# Patient Record
Sex: Male | Born: 1985 | Race: Black or African American | Hispanic: No | Marital: Single | Smoking: Current every day smoker
Health system: Southern US, Community
[De-identification: ages and names within clinical notes are randomized; demographics above are authoritative.]

## PROBLEM LIST (undated history)

## (undated) DIAGNOSIS — E7800 Pure hypercholesterolemia, unspecified: Secondary | ICD-10-CM

## (undated) DIAGNOSIS — E119 Type 2 diabetes mellitus without complications: Secondary | ICD-10-CM

## (undated) DIAGNOSIS — I10 Essential (primary) hypertension: Secondary | ICD-10-CM

## (undated) HISTORY — DX: Essential (primary) hypertension: I10

## (undated) HISTORY — DX: Type 2 diabetes mellitus without complications: E11.9

## (undated) HISTORY — PX: NO PAST SURGERIES: SHX2092

---

## 2005-04-24 ENCOUNTER — Emergency Department: Payer: Self-pay | Admitting: Emergency Medicine

## 2008-11-17 ENCOUNTER — Emergency Department (HOSPITAL_COMMUNITY): Admission: EM | Admit: 2008-11-17 | Discharge: 2008-11-17 | Payer: Self-pay | Admitting: Emergency Medicine

## 2009-04-23 ENCOUNTER — Emergency Department: Payer: Self-pay | Admitting: Emergency Medicine

## 2010-02-10 ENCOUNTER — Emergency Department: Payer: Self-pay | Admitting: Internal Medicine

## 2011-04-02 ENCOUNTER — Emergency Department: Payer: Self-pay | Admitting: Emergency Medicine

## 2011-04-03 ENCOUNTER — Emergency Department (HOSPITAL_COMMUNITY): Payer: Medicaid Other

## 2011-04-03 ENCOUNTER — Encounter: Payer: Self-pay | Admitting: *Deleted

## 2011-04-03 ENCOUNTER — Emergency Department (HOSPITAL_COMMUNITY)
Admission: EM | Admit: 2011-04-03 | Discharge: 2011-04-03 | Disposition: A | Discharge status: Home / Self Care | Payer: Medicaid Other | Attending: Emergency Medicine | Admitting: Emergency Medicine

## 2011-04-03 DIAGNOSIS — M25519 Pain in unspecified shoulder: Secondary | ICD-10-CM | POA: Insufficient documentation

## 2011-04-03 DIAGNOSIS — S139XXA Sprain of joints and ligaments of unspecified parts of neck, initial encounter: Secondary | ICD-10-CM | POA: Insufficient documentation

## 2011-04-03 DIAGNOSIS — S161XXA Strain of muscle, fascia and tendon at neck level, initial encounter: Secondary | ICD-10-CM

## 2011-04-03 DIAGNOSIS — S39012A Strain of muscle, fascia and tendon of lower back, initial encounter: Secondary | ICD-10-CM

## 2011-04-03 DIAGNOSIS — S335XXA Sprain of ligaments of lumbar spine, initial encounter: Secondary | ICD-10-CM | POA: Insufficient documentation

## 2011-04-03 MED ORDER — PREDNISONE 10 MG PO TABS: ORAL_TABLET | ORAL | Status: DC

## 2011-04-03 MED ORDER — METHOCARBAMOL 500 MG PO TABS: ORAL_TABLET | ORAL | Status: DC

## 2011-04-03 NOTE — ED Notes (Signed)
Pt a/ox4. Resp even and unlabored. NAD at this time. D/C instructions reviewed with pt. Pt verbalized understanding. Pt ambulated with steady gate to POV. 
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
## 2011-04-03 NOTE — ED Provider Notes (Signed)
Medical screening examination/treatment/procedure(s) were performed by non-physician practitioner and as supervising physician I was immediately available for consultation/collaboration.   Lyanne Co, MD 04/03/11 [phone]

## 2011-10-23 ENCOUNTER — Emergency Department: Payer: Self-pay | Admitting: Emergency Medicine

## 2012-01-13 ENCOUNTER — Emergency Department: Payer: Self-pay | Admitting: Emergency Medicine

## 2012-12-18 ENCOUNTER — Emergency Department: Payer: Self-pay | Admitting: Emergency Medicine

## 2014-10-10 DIAGNOSIS — I10 Essential (primary) hypertension: Secondary | ICD-10-CM | POA: Insufficient documentation

## 2016-07-15 ENCOUNTER — Encounter: Payer: Self-pay | Admitting: Emergency Medicine

## 2016-07-15 ENCOUNTER — Emergency Department
Admission: EM | Admit: 2016-07-15 | Discharge: 2016-07-15 | Disposition: A | Discharge status: Home / Self Care | Payer: BLUE CROSS/BLUE SHIELD | Attending: Emergency Medicine | Admitting: Emergency Medicine

## 2016-07-15 DIAGNOSIS — F17200 Nicotine dependence, unspecified, uncomplicated: Secondary | ICD-10-CM | POA: Diagnosis not present

## 2016-07-15 DIAGNOSIS — A549 Gonococcal infection, unspecified: Secondary | ICD-10-CM

## 2016-07-15 DIAGNOSIS — R300 Dysuria: Secondary | ICD-10-CM | POA: Diagnosis present

## 2016-07-15 LAB — URINALYSIS, COMPLETE (UACMP) WITH MICROSCOPIC
BILIRUBIN URINE: NEGATIVE
Glucose, UA: NEGATIVE mg/dL
HGB URINE DIPSTICK: NEGATIVE
Ketones, ur: 20 mg/dL — AB
Nitrite: NEGATIVE
PH: 6 (ref 5.0–8.0)
Protein, ur: 100 mg/dL — AB
SPECIFIC GRAVITY, URINE: 1.033 — AB (ref 1.005–1.030)
Squamous Epithelial / LPF: NONE SEEN

## 2016-07-15 LAB — CHLAMYDIA/NGC RT PCR (ARMC ONLY)
Chlamydia Tr: NOT DETECTED
N GONORRHOEAE: DETECTED — AB

## 2016-07-15 MED ORDER — CEFTRIAXONE SODIUM 250 MG IJ SOLR
250.0000 mg | Freq: Once | INTRAMUSCULAR | Status: AC
  Administered 2016-07-15: 250 mg via INTRAMUSCULAR
  Filled 2016-07-15: qty 250

## 2016-07-15 NOTE — ED Notes (Signed)
Pt stated he noticed some penile discharge about 2 weeks ago. Wanted to get checked out.

## 2016-07-15 NOTE — ED Provider Notes (Signed)
[facility] Emergency Department Provider Note   ____________________________________________   First MD Initiated Contact with Patient 07/15/16 2136     (approximate)  I have reviewed the triage vital signs and the nursing notes.   HISTORY  Chief Complaint Penile Discharge    HPI Devin Andrews is a 30 y.o. male patient complain of pain at discharge and dysuria for 2 days. Patient state he batted baseball to either Chlamydia or gonorrhea. Last Sexual contact unprotected 5-6 days ago. Patient denies any penial lesions or inguinal swelling. No palliative measures taken for this complaint. Patient rates his pain discomfort as a 5/10. Patient described a pain as "burning".   History reviewed. No pertinent past medical history.  There are no active problems to display for this patient.   History reviewed. No pertinent surgical history.  Prior to Admission medications   Medication Sig Start Date End Date Taking? Authorizing Provider  methocarbamol (ROBAXIN) 500 MG tablet 2 po tid for spasm 04/03/11   Ivery QualeHobson Bryant, PA-C  predniSONE (DELTASONE) 10 MG tablet 6,5,4,3,2,1 - take with food 04/03/11   Ivery QualeHobson Bryant, PA-C    Allergies Bee venom  No family history on file.  Social History Social History  Substance Use Topics  . Smoking status: Current Every Day Smoker    Packs/day: 1.00  . Smokeless tobacco: Former NeurosurgeonUser  . Alcohol use No    Review of Systems Constitutional: No fever/chills Eyes: No visual changes. ENT: No sore throat. Cardiovascular: Denies chest pain. Respiratory: Denies shortness of breath. Gastrointestinal: No abdominal pain.  No nausea, no vomiting.  No diarrhea.  No constipation. Genitourinary: Positive for dysuria. Musculoskeletal: Negative for back pain. Skin: Negative for rash. Neurological: Negative for headaches, focal weakness or numbness.    ____________________________________________   PHYSICAL  EXAM:  VITAL SIGNS: ED Triage Vitals  Enc Vitals Group     BP 07/15/16 1939 (!) 155/104     Pulse Rate 07/15/16 1939 94     Resp 07/15/16 1939 20     Temp 07/15/16 1939 97.6 F (36.4 C)     Temp Source 07/15/16 1939 Oral     SpO2 07/15/16 1939 97 %     Weight 07/15/16 1938 280 lb (127 kg)     Height 07/15/16 1938 6\' 3"  (1.905 m)     Head Circumference --      Peak Flow --      Pain Score 07/15/16 1938 5     Pain Loc --      Pain Edu? --      Excl. in GC? --     Constitutional: Alert and oriented. Well appearing and in no acute distress. Eyes: Conjunctivae are normal. PERRL. EOMI. Head: Atraumatic. Nose: No congestion/rhinnorhea. Mouth/Throat: Mucous membranes are moist.  Oropharynx non-erythematous. Neck: No stridor.  No cervical spine tenderness to palpation. Hematological/Lymphatic/Immunilogical: No cervical lymphadenopathy. Cardiovascular: Normal rate, regular rhythm. Grossly normal heart sounds.  Good peripheral circulation. Respiratory: Normal respiratory effort.  No retractions. Lungs CTAB. Gastrointestinal: Soft and nontender. No distention. No abdominal bruits. No CVA tenderness. Musculoskeletal: No lower extremity tenderness nor edema.  No joint effusions. Neurologic:  Normal speech and language. No gross focal neurologic deficits are appreciated. No gait instability. Skin:  Skin is warm, dry and intact. No rash noted. Psychiatric: Mood and affect are normal. Speech and behavior are normal.  ____________________________________________   LABS (all labs ordered are listed, but only abnormal results are displayed)  Labs Reviewed  CHLAMYDIA/NGC RT PCR [facility]  ONLY) - Abnormal; Notable for the following:       Result Value   N gonorrhoeae DETECTED (*)    All other components within normal limits  URINALYSIS, COMPLETE (UACMP) WITH MICROSCOPIC - Abnormal; Notable for the following:    Color, Urine YELLOW (*)    APPearance CLOUDY (*)    Specific Gravity,  Urine 1.033 (*)    Ketones, ur 20 (*)    Protein, ur 100 (*)    Leukocytes, UA LARGE (*)    Bacteria, UA RARE (*)    All other components within normal limits   ____________________________________________  EKG   ____________________________________________  RADIOLOGY   ____________________________________________   PROCEDURES  Procedure(s) performed: None  Procedures  Critical Care performed: No  ____________________________________________   INITIAL IMPRESSION / ASSESSMENT AND PLAN / ED COURSE  Pertinent labs & imaging results that were available during my care of the patient were reviewed by me and considered in my medical decision making (see chart for details).  Gonorrhea. Patient given discharge care instructions and [facility]  Clinical Course   Discussed lab results with patient. Patient given Rocephin 250 mg IM.   ____________________________________________   FINAL CLINICAL IMPRESSION(S) / ED DIAGNOSES  Final diagnoses:  Gonorrhea      NEW MEDICATIONS STARTED DURING THIS VISIT:  New Prescriptions   No medications on file     Note:  This document was prepared using Dragon voice recognition software and may include unintentional dictation errors.    Joni ReiningRonald K Amiri Tritch, PA-C 07/15/16 2231    Sharman CheekPhillip Stafford, MD 07/16/16 [phone]

## 2016-07-15 NOTE — ED Triage Notes (Addendum)
Patient ambulatory to triage with steady gait, without difficulty or distress noted; pt reports penile discharge, dysuria and thinks he has been exposed to chlamydia and gonnhorea

## 2016-07-16 ENCOUNTER — Telehealth: Payer: Self-pay | Admitting: Emergency Medicine

## 2016-07-16 NOTE — Telephone Encounter (Signed)
Called patient to ask him to take one more antibiotic to comply with cdc guidelines for treatment of gonorrhea.  Per dr Darnelle Catalanmalinda call in azithromycin 1 gram po. Med called to [facility] [place].

## 2016-09-18 ENCOUNTER — Encounter: Payer: Self-pay | Admitting: *Deleted

## 2016-09-18 ENCOUNTER — Telehealth: Payer: Self-pay | Admitting: Emergency Medicine

## 2016-09-18 ENCOUNTER — Emergency Department
Admission: EM | Admit: 2016-09-18 | Discharge: 2016-09-18 | Disposition: A | Discharge status: Home / Self Care | Payer: BLUE CROSS/BLUE SHIELD | Attending: Emergency Medicine | Admitting: Emergency Medicine

## 2016-09-18 DIAGNOSIS — Z79899 Other long term (current) drug therapy: Secondary | ICD-10-CM | POA: Insufficient documentation

## 2016-09-18 DIAGNOSIS — N342 Other urethritis: Secondary | ICD-10-CM | POA: Insufficient documentation

## 2016-09-18 DIAGNOSIS — F17200 Nicotine dependence, unspecified, uncomplicated: Secondary | ICD-10-CM | POA: Diagnosis not present

## 2016-09-18 DIAGNOSIS — A64 Unspecified sexually transmitted disease: Secondary | ICD-10-CM

## 2016-09-18 DIAGNOSIS — R369 Urethral discharge, unspecified: Secondary | ICD-10-CM | POA: Diagnosis present

## 2016-09-18 LAB — URINALYSIS, COMPLETE (UACMP) WITH MICROSCOPIC
BILIRUBIN URINE: NEGATIVE
Bacteria, UA: NONE SEEN
GLUCOSE, UA: NEGATIVE mg/dL
HGB URINE DIPSTICK: NEGATIVE
KETONES UR: NEGATIVE mg/dL
Nitrite: NEGATIVE
PH: 6 (ref 5.0–8.0)
Protein, ur: NEGATIVE mg/dL
Specific Gravity, Urine: 1.026 (ref 1.005–1.030)
Squamous Epithelial / LPF: NONE SEEN

## 2016-09-18 LAB — CHLAMYDIA/NGC RT PCR (ARMC ONLY)
Chlamydia Tr: NOT DETECTED
N GONORRHOEAE: DETECTED — AB

## 2016-09-18 MED ORDER — LIDOCAINE HCL (PF) 1 % IJ SOLN
INTRAMUSCULAR | Status: AC
  Filled 2016-09-18: qty 5

## 2016-09-18 MED ORDER — CEFTRIAXONE SODIUM 1 G IJ SOLR
500.0000 mg | Freq: Once | INTRAMUSCULAR | Status: AC
  Administered 2016-09-18: 500 mg via INTRAMUSCULAR
  Filled 2016-09-18: qty 10

## 2016-09-18 MED ORDER — AZITHROMYCIN 500 MG PO TABS
1000.0000 mg | ORAL_TABLET | Freq: Once | ORAL | Status: AC
  Administered 2016-09-18: 1000 mg via ORAL
  Filled 2016-09-18: qty 2

## 2016-09-18 NOTE — Telephone Encounter (Signed)
Called patient and informed of std results.  He was treated here already

## 2016-09-18 NOTE — ED Provider Notes (Signed)
[facility]-EMERGENCY DEPARTMENT Provider Note   CSN: [id number] Arrival date & time: 09/18/16  1527     History   Chief Complaint Chief Complaint  Patient presents with  . SEXUALLY TRANSMITTED DISEASE    HPI Devin Andrews is a 31 y.o. male presents to the emergency department for evaluation of penile discharge and burning sensation. Patient has had symptoms for 3 days. Was diagnosed with gonorrhea on 07/15/2016. Patient was treated with ceftriaxone. He now has a new sexual partner, male. She is also having some symptoms. Patient denies any testicular pain, swelling. No lesions or painful sores along the penis.  HPI  History reviewed. No pertinent past medical history.  There are no active problems to display for this patient.   History reviewed. No pertinent surgical history.     Home Medications    Prior to Admission medications   Medication Sig Start Date End Date Taking? Authorizing Provider  methocarbamol (ROBAXIN) 500 MG tablet 2 po tid for spasm 04/03/11   Ivery QualeHobson Bryant, PA-C  predniSONE (DELTASONE) 10 MG tablet 6,5,4,3,2,1 - take with food 04/03/11   Ivery QualeHobson Bryant, PA-C    Family History No family history on file.  Social History Social History  Substance Use Topics  . Smoking status: Current Every Day Smoker    Packs/day: 1.00  . Smokeless tobacco: Former NeurosurgeonUser  . Alcohol use No     Allergies   Bee venom   Review of Systems Review of Systems  Constitutional: Negative.  Negative for activity change, appetite change, chills and fever.  HENT: Negative for congestion, ear pain, mouth sores, rhinorrhea, sinus pressure, sore throat and trouble swallowing.   Eyes: Negative for photophobia, pain and discharge.  Respiratory: Negative for cough, chest tightness and shortness of breath.   Cardiovascular: Negative for chest pain and leg swelling.  Gastrointestinal: Negative for abdominal distention, abdominal pain, diarrhea, nausea and vomiting.    Genitourinary: Positive for discharge. Negative for difficulty urinating, dysuria, penile pain, penile swelling, scrotal swelling and testicular pain.  Musculoskeletal: Negative for arthralgias, back pain and gait problem.  Skin: Negative for color change and rash.  Neurological: Negative for dizziness and headaches.  Hematological: Negative for adenopathy.  Psychiatric/Behavioral: Negative for agitation and behavioral problems.     Physical Exam Updated Vital Signs BP (!) 164/100 (BP Location: Right Arm)   Pulse 64   Temp 98.8 F (37.1 C) (Oral)   Resp 20   Ht 6\' 3"  (1.905 m)   Wt 136.1 kg   SpO2 99%   BMI 37.50 kg/m   Physical Exam  Constitutional: He appears well-developed and well-nourished.  HENT:  Head: Normocephalic and atraumatic.  Eyes: Conjunctivae are normal.  Neck: Neck supple.  Cardiovascular: Normal rate and regular rhythm.   No murmur heard. Pulmonary/Chest: Effort normal and breath sounds normal. No respiratory distress.  Abdominal: Soft. There is no tenderness.  Genitourinary:  Genitourinary Comments: Patient is circumcised. There is mild clear penile discharge. There is no sign of skin lesions. Scrotum is soft nontender. Testicles nontender to palpation.  Musculoskeletal: He exhibits no edema.  Neurological: He is alert.  Skin: Skin is warm and dry.  Psychiatric: He has a normal mood and affect.  Nursing note and vitals reviewed.    ED Treatments / Results  Labs (all labs ordered are listed, but only abnormal results are displayed) Labs Reviewed  CHLAMYDIA/NGC RT PCR ([facility] ONLY)  URINALYSIS, COMPLETE (UACMP) WITH MICROSCOPIC    EKG  EKG Interpretation None  Radiology No results found.  Procedures Procedures (including critical care time)  Medications Ordered in ED Medications  cefTRIAXone (ROCEPHIN) injection 500 mg (not administered)  azithromycin (ZITHROMAX) tablet 1,000 mg (not administered)     Initial  Impression / Assessment and Plan / ED Course  I have reviewed the triage vital signs and the nursing notes.  Pertinent labs & imaging results that were available during my care of the patient were reviewed by me and considered in my medical decision making (see chart for details).     31 year old male with urethritis. Patient is treated with ceftriaxone and azithromycin. He will follow-up in the health department in one week. He is educated on avoiding sexual contact.  Final Clinical Impressions(s) / ED Diagnoses   Final diagnoses:  STD (sexually transmitted disease)  Urethritis    New Prescriptions New Prescriptions   No medications on file     Evon Slack, PA-C 09/18/16 1618    Governor Rooks, MD 09/18/16 [phone]

## 2016-09-18 NOTE — ED Triage Notes (Signed)
Pt was treated 1 month ago for gonorrhea, pt reports penile drainage with dysuria
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
## 2016-09-18 NOTE — Discharge Instructions (Signed)
Please avoid any sexual contact with partner. Please notify all partners of diagnosis. Please follow-up in the health department in one week for recheck.

## 2018-08-14 ENCOUNTER — Encounter (HOSPITAL_COMMUNITY): Payer: Self-pay | Admitting: Emergency Medicine

## 2018-08-14 ENCOUNTER — Emergency Department (HOSPITAL_COMMUNITY)
Admission: EM | Admit: 2018-08-14 | Discharge: 2018-08-14 | Discharge status: Against Medical Advice | Payer: BLUE CROSS/BLUE SHIELD | Attending: Emergency Medicine | Admitting: Emergency Medicine

## 2018-08-14 DIAGNOSIS — Z5321 Procedure and treatment not carried out due to patient leaving prior to being seen by health care provider: Secondary | ICD-10-CM | POA: Diagnosis not present

## 2018-08-14 DIAGNOSIS — M549 Dorsalgia, unspecified: Secondary | ICD-10-CM | POA: Diagnosis present

## 2018-08-14 NOTE — ED Notes (Signed)
Patient called for room - no answer.  

## 2018-08-14 NOTE — ED Triage Notes (Addendum)
Pt did not answer.

## 2018-08-14 NOTE — ED Notes (Signed)
Pt called 3x with no answer for room

## 2018-08-18 ENCOUNTER — Other Ambulatory Visit: Payer: Self-pay

## 2018-08-18 ENCOUNTER — Emergency Department (HOSPITAL_COMMUNITY)
Admission: EM | Admit: 2018-08-18 | Discharge: 2018-08-18 | Disposition: A | Discharge status: Home / Self Care | Payer: BLUE CROSS/BLUE SHIELD | Attending: Emergency Medicine | Admitting: Emergency Medicine

## 2018-08-18 DIAGNOSIS — I10 Essential (primary) hypertension: Secondary | ICD-10-CM | POA: Insufficient documentation

## 2018-08-18 DIAGNOSIS — R040 Epistaxis: Secondary | ICD-10-CM | POA: Insufficient documentation

## 2018-08-18 DIAGNOSIS — F17200 Nicotine dependence, unspecified, uncomplicated: Secondary | ICD-10-CM | POA: Diagnosis not present

## 2018-08-18 MED ORDER — OXYMETAZOLINE HCL 0.05 % NA SOLN
1.0000 | Freq: Once | NASAL | Status: AC
  Administered 2018-08-18: 1 via NASAL
  Filled 2018-08-18: qty 15

## 2018-08-18 MED ORDER — TRANEXAMIC ACID 1000 MG/10ML IV SOLN
500.0000 mg | Freq: Once | INTRAVENOUS | Status: AC
  Administered 2018-08-18: 500 mg via TOPICAL
  Filled 2018-08-18: qty 10

## 2018-08-18 MED ORDER — CHLORTHALIDONE 25 MG PO TABS: 25.0000 mg | ORAL_TABLET | Freq: Every day | ORAL | 0 refills | Status: DC

## 2018-08-18 MED ORDER — AMLODIPINE BESYLATE 5 MG PO TABS: 5.0000 mg | ORAL_TABLET | Freq: Every day | ORAL | 0 refills | Status: DC

## 2018-08-18 NOTE — ED Notes (Signed)
ENT cart at bedside and Dr. Shonna Chock in room.

## 2018-08-18 NOTE — ED Notes (Signed)
Patient spit up medium sized blood clot

## 2018-08-18 NOTE — ED Notes (Signed)
Pt and family state they understand instructions. Home stable with steady gait. 

## 2018-08-18 NOTE — Discharge Instructions (Addendum)
Monitor your blood pressure on a regular basis.  At least once a week, but preferably once a day.  Keep a record of your blood pressure readings, and take that record with you when you see your primary care provider.

## 2018-08-18 NOTE — ED Provider Notes (Signed)
[facility] EMERGENCY DEPARTMENT Provider Note   CSN: [id number] Arrival date & time: 08/18/18  0257     History   Chief Complaint Chief Complaint  Patient presents with  . Epistaxis    HPI Devin Andrews is a 33 y.o. male.  The history is provided by the patient.  Epistaxis  He comes in with right-sided nosebleed throughout the day today.  He does admit to having picked up his nose.  He denies aspirin use but does admit to ibuprofen use.  He is not on any anticoagulants or any other antiplatelet agents.  He has had minor nosebleeds in the past, but nothing this serious.  No past medical history on file.  There are no active problems to display for this patient.   No past surgical history on file.      Home Medications    Prior to Admission medications   Medication Sig Start Date End Date Taking? Authorizing Provider  methocarbamol (ROBAXIN) 500 MG tablet 2 po tid for spasm 04/03/11   Ivery Quale, PA-C  predniSONE (DELTASONE) 10 MG tablet 6,5,4,3,2,1 - take with food 04/03/11   Ivery Quale, PA-C    Family History No family history on file.  Social History Social History   Tobacco Use  . Smoking status: Current Every Day Smoker    Packs/day: 1.00  . Smokeless tobacco: Former Engineer, water Use Topics  . Alcohol use: No  . Drug use: No     Allergies   Bee venom   Review of Systems Review of Systems  HENT: Positive for nosebleeds.   All other systems reviewed and are negative.    Physical Exam Updated Vital Signs BP (!) 168/127   Pulse 78   Temp 98 F (36.7 C) (Oral)   Resp 20   Ht 6\' 3"  (1.905 m)   Wt (!) 145.2 kg   SpO2 100%   BMI 40.00 kg/m   Physical Exam Vitals signs and nursing note reviewed.    Obese 33 year old male, resting comfortably and in no acute distress. Vital signs are significant for elevated blood pressure. Oxygen saturation is 100%, which is normal. Head is normocephalic and atraumatic.  PERRLA, EOMI. Oropharynx is clear.  Blood seen in the right nostril with no clear bleeding site. Neck is nontender and supple without adenopathy or JVD. Back is nontender and there is no CVA tenderness. Lungs are clear without rales, wheezes, or rhonchi. Chest is nontender. Heart has regular rate and rhythm without murmur. Abdomen is soft, flat, nontender without masses or hepatosplenomegaly and peristalsis is normoactive. Extremities have no cyanosis or edema, full range of motion is present. Skin is warm and dry without rash. Neurologic: Mental status is normal, cranial nerves are intact, there are no motor or sensory deficits.  ED Treatments / Results   Procedures .Epistaxis Management Date/Time: 08/18/2018 4:11 AM Performed by: Dione Booze, MD Authorized by: Dione Booze, MD   Consent:    Consent obtained:  Verbal   Consent given by:  Patient   Risks discussed:  Pain and bleeding   Alternatives discussed:  No treatment Anesthesia (see MAR for exact dosages):    Anesthesia method:  None Procedure details:    Treatment site:  R anterior   Repair method: topical tranexamic acid.   Treatment complexity:  Limited   Treatment episode: initial   Post-procedure details:    Assessment:  No improvement   Patient tolerance of procedure:  Tolerated well, no immediate complications  Comments:     Bleeding initially stopped, but then restarted.  Attempt was made with aerosolized tranexamic acid with no improvement.     Medications Ordered in ED Medications  tranexamic acid (CYKLOKAPRON) injection 500 mg (has no administration in time range)  oxymetazoline (AFRIN) 0.05 % nasal spray 1 spray (1 spray Each Nare Given 08/18/18 0317)     Initial Impression / Assessment and Plan / ED Course  I have reviewed the triage vital signs and the nursing notes.  Epistaxis.  Elevated blood pressure.  Old records are reviewed, and it is noted that he is treated for hypertension with  losartan/hydrochlorthiazide 100/25.  Patient does state that he is compliant with this, but has not seen his physician recently.  He is inconsistent on monitoring blood pressure at home.`  Epistaxis is treated with topical tranexamic acid with initial improvement, but then recurrence of bleeding.  This was followed by aerosolized tranexamic acid with no improvement.  Attempted to place Rapid Rhino 7.5 cm, but patient would not allow this to be placed.  Case is discussed with Dr. Doran Heater, on-call for ENT who agrees to see the patient in her office later today.  I have discussed the patient's blood pressure management.  Apparently, he had been placed on losartan-hydrochlorothiazide combination pill last year with no improvement in his blood pressure.  He was then put on a higher dose, but never had any improvement in his blood pressure.  He states that he did check it periodically.  He is not currently taking any medication for his blood pressure.  He is given a prescription for amlodipine and chlorthalidone and advised to monitor his blood pressure.  He needs to get established with a primary care provider to make appropriate adjustments in his blood pressure medication.   Final Clinical Impressions(s) / ED Diagnoses   Final diagnoses:  Right-sided epistaxis  Essential hypertension    ED Discharge Orders         Ordered    amLODipine (NORVASC) 5 MG tablet  Daily     08/18/18 0727    chlorthalidone (HYGROTON) 25 MG tablet  Daily     08/18/18 0727           Dione Booze, MD 08/18/18 [phone]

## 2018-08-18 NOTE — ED Triage Notes (Signed)
Patient c/o intermittent nose bleed since yesterday. States takes no anticoagulation medications.

## 2018-09-03 ENCOUNTER — Ambulatory Visit (INDEPENDENT_AMBULATORY_CARE_PROVIDER_SITE_OTHER): Payer: BLUE CROSS/BLUE SHIELD | Admitting: Critical Care Medicine

## 2018-09-18 ENCOUNTER — Encounter: Payer: Self-pay | Admitting: Family Medicine

## 2018-09-18 ENCOUNTER — Ambulatory Visit: Payer: BLUE CROSS/BLUE SHIELD | Attending: Family Medicine | Admitting: Family Medicine

## 2018-09-18 VITALS — BP 138/85 | HR 75 | Temp 98.0°F | Resp 18 | Ht 75.0 in | Wt 333.0 lb

## 2018-09-18 DIAGNOSIS — I10 Essential (primary) hypertension: Secondary | ICD-10-CM | POA: Diagnosis not present

## 2018-09-18 DIAGNOSIS — Z91030 Bee allergy status: Secondary | ICD-10-CM

## 2018-09-18 DIAGNOSIS — H6691 Otitis media, unspecified, right ear: Secondary | ICD-10-CM

## 2018-09-18 DIAGNOSIS — R7303 Prediabetes: Secondary | ICD-10-CM

## 2018-09-18 DIAGNOSIS — G479 Sleep disorder, unspecified: Secondary | ICD-10-CM | POA: Diagnosis not present

## 2018-09-18 DIAGNOSIS — F17200 Nicotine dependence, unspecified, uncomplicated: Secondary | ICD-10-CM | POA: Insufficient documentation

## 2018-09-18 DIAGNOSIS — Z6841 Body Mass Index (BMI) 40.0 and over, adult: Secondary | ICD-10-CM

## 2018-09-18 DIAGNOSIS — R350 Frequency of micturition: Secondary | ICD-10-CM | POA: Diagnosis not present

## 2018-09-18 LAB — POCT URINALYSIS DIP (CLINITEK)
Bilirubin, UA: NEGATIVE
Blood, UA: NEGATIVE
Glucose, UA: NEGATIVE mg/dL
Ketones, POC UA: NEGATIVE mg/dL
Leukocytes, UA: NEGATIVE
Nitrite, UA: NEGATIVE
POC PROTEIN,UA: NEGATIVE
Spec Grav, UA: 1.02
Urobilinogen, UA: 0.2 U/dL
pH, UA: 6.5

## 2018-09-18 LAB — POCT GLYCOSYLATED HEMOGLOBIN (HGB A1C): Hemoglobin A1C: 6.3 % — AB (ref 4.0–5.6)

## 2018-09-18 LAB — GLUCOSE, POCT (MANUAL RESULT ENTRY): POC Glucose: 153 mg/dL — AB (ref 70–99)

## 2018-09-18 MED ORDER — METFORMIN HCL 500 MG PO TABS: ORAL_TABLET | ORAL | 3 refills | Status: DC

## 2018-09-18 MED ORDER — EPINEPHRINE 0.3 MG/0.3ML IJ SOAJ: 0.3000 mg | INTRAMUSCULAR | 11 refills | Status: DC | PRN

## 2018-09-18 MED ORDER — CHLORTHALIDONE 25 MG PO TABS: 25.0000 mg | ORAL_TABLET | Freq: Every day | ORAL | 3 refills | Status: DC

## 2018-09-18 MED ORDER — AMLODIPINE BESYLATE 5 MG PO TABS: 5.0000 mg | ORAL_TABLET | Freq: Every day | ORAL | 3 refills | Status: DC

## 2018-09-18 MED ORDER — AMOXICILLIN 875 MG PO TABS: 875.0000 mg | ORAL_TABLET | Freq: Two times a day (BID) | ORAL | 0 refills | Status: AC

## 2018-09-18 NOTE — Patient Instructions (Addendum)
DASH Eating Plan  DASH stands for "Dietary Approaches to Stop Hypertension." The DASH eating plan is a healthy eating plan that has been shown to reduce high blood pressure (hypertension). It may also reduce your risk for type 2 diabetes, heart disease, and stroke. The DASH eating plan may also help with weight loss.  What are tips for following this plan?    General guidelines  · Avoid eating more than 2,300 mg (milligrams) of salt (sodium) a day. If you have hypertension, you may need to reduce your sodium intake to 1,500 mg a day.  · Limit alcohol intake to no more than 1 drink a day for nonpregnant women and 2 drinks a day for men. One drink equals 12 oz of beer, 5 oz of wine, or 1½ oz of hard liquor.  · Work with your health care provider to maintain a healthy body weight or to lose weight. Ask what an ideal weight is for you.  · Get at least 30 minutes of exercise that causes your heart to beat faster (aerobic exercise) most days of the week. Activities may include walking, swimming, or biking.  · Work with your health care provider or diet and nutrition specialist (dietitian) to adjust your eating plan to your individual calorie needs.  Reading food labels    · Check food labels for the amount of sodium per serving. Choose foods with less than 5 percent of the Daily Value of sodium. Generally, foods with less than 300 mg of sodium per serving fit into this eating plan.  · To find whole grains, look for the word "whole" as the first word in the ingredient list.  Shopping  · Buy products labeled as "low-sodium" or "no salt added."  · Buy fresh foods. Avoid canned foods and premade or frozen meals.  Cooking  · Avoid adding salt when cooking. Use salt-free seasonings or herbs instead of table salt or sea salt. Check with your health care provider or pharmacist before using salt substitutes.  · Do not fry foods. Cook foods using healthy methods such as baking, boiling, grilling, and broiling instead.  · Cook with  heart-healthy oils, such as olive, canola, soybean, or sunflower oil.  Meal planning  · Eat a balanced diet that includes:  ? 5 or more servings of fruits and vegetables each day. At each meal, try to fill half of your plate with fruits and vegetables.  ? Up to 6-8 servings of whole grains each day.  ? Less than 6 oz of lean meat, poultry, or fish each day. A 3-oz serving of meat is about the same size as a deck of cards. One egg equals 1 oz.  ? 2 servings of low-fat dairy each day.  ? A serving of nuts, seeds, or beans 5 times each week.  ? Heart-healthy fats. Healthy fats called Omega-3 fatty acids are found in foods such as flaxseeds and coldwater fish, like sardines, salmon, and mackerel.  · Limit how much you eat of the following:  ? Canned or prepackaged foods.  ? Food that is high in trans fat, such as fried foods.  ? Food that is high in saturated fat, such as fatty meat.  ? Sweets, desserts, sugary drinks, and other foods with added sugar.  ? Full-fat dairy products.  · Do not salt foods before eating.  · Try to eat at least 2 vegetarian meals each week.  · Eat more home-cooked food and less restaurant, buffet, and fast food.  ·   When eating at a restaurant, ask that your food be prepared with less salt or no salt, if possible.  What foods are recommended?  The items listed may not be a complete list. Talk with your dietitian about what dietary choices are best for you.  Grains  Whole-grain or whole-wheat bread. Whole-grain or whole-wheat pasta. Brown rice. Oatmeal. Quinoa. Bulgur. Whole-grain and low-sodium cereals. Pita bread. Low-fat, low-sodium crackers. Whole-wheat flour tortillas.  Vegetables  Fresh or frozen vegetables (raw, steamed, roasted, or grilled). Low-sodium or reduced-sodium tomato and vegetable juice. Low-sodium or reduced-sodium tomato sauce and tomato paste. Low-sodium or reduced-sodium canned vegetables.  Fruits  All fresh, dried, or frozen fruit. Canned fruit in natural juice (without  added sugar).  Meat and other protein foods  Skinless chicken or turkey. Ground chicken or turkey. Pork with fat trimmed off. Fish and seafood. Egg whites. Dried beans, peas, or lentils. Unsalted nuts, nut butters, and seeds. Unsalted canned beans. Lean cuts of beef with fat trimmed off. Low-sodium, lean deli meat.  Dairy  Low-fat (1%) or fat-free (skim) milk. Fat-free, low-fat, or reduced-fat cheeses. Nonfat, low-sodium ricotta or cottage cheese. Low-fat or nonfat yogurt. Low-fat, low-sodium cheese.  Fats and oils  Soft margarine without trans fats. Vegetable oil. Low-fat, reduced-fat, or light mayonnaise and salad dressings (reduced-sodium). Canola, safflower, olive, soybean, and sunflower oils. Avocado.  Seasoning and other foods  Herbs. Spices. Seasoning mixes without salt. Unsalted popcorn and pretzels. Fat-free sweets.  What foods are not recommended?  The items listed may not be a complete list. Talk with your dietitian about what dietary choices are best for you.  Grains  Baked goods made with fat, such as croissants, muffins, or some breads. Dry pasta or rice meal packs.  Vegetables  Creamed or fried vegetables. Vegetables in a cheese sauce. Regular canned vegetables (not low-sodium or reduced-sodium). Regular canned tomato sauce and paste (not low-sodium or reduced-sodium). Regular tomato and vegetable juice (not low-sodium or reduced-sodium). Pickles. Olives.  Fruits  Canned fruit in a light or heavy syrup. Fried fruit. Fruit in cream or butter sauce.  Meat and other protein foods  Fatty cuts of meat. Ribs. Fried meat. Bacon. Sausage. Bologna and other processed lunch meats. Salami. Fatback. Hotdogs. Bratwurst. Salted nuts and seeds. Canned beans with added salt. Canned or smoked fish. Whole eggs or egg yolks. Chicken or turkey with skin.  Dairy  Whole or 2% milk, cream, and half-and-half. Whole or full-fat cream cheese. Whole-fat or sweetened yogurt. Full-fat cheese. Nondairy creamers. Whipped toppings.  Processed cheese and cheese spreads.  Fats and oils  Butter. Stick margarine. Lard. Shortening. Ghee. Bacon fat. Tropical oils, such as coconut, palm kernel, or palm oil.  Seasoning and other foods  Salted popcorn and pretzels. Onion salt, garlic salt, seasoned salt, table salt, and sea salt. Worcestershire sauce. Tartar sauce. Barbecue sauce. Teriyaki sauce. Soy sauce, including reduced-sodium. Steak sauce. Canned and packaged gravies. Fish sauce. Oyster sauce. Cocktail sauce. Horseradish that you find on the shelf. Ketchup. Mustard. Meat flavorings and tenderizers. Bouillon cubes. Hot sauce and Tabasco sauce. Premade or packaged marinades. Premade or packaged taco seasonings. Relishes. Regular salad dressings.  Where to find more information:  · National Heart, Lung, and Blood Institute: www.nhlbi.nih.gov  · American Heart Association: www.heart.org  Summary  · The DASH eating plan is a healthy eating plan that has been shown to reduce high blood pressure (hypertension). It may also reduce your risk for type 2 diabetes, heart disease, and stroke.  · With the   DASH eating plan, you should limit salt (sodium) intake to 2,300 mg a day. If you have hypertension, you may need to reduce your sodium intake to 1,500 mg a day.  · When on the DASH eating plan, aim to eat more fresh fruits and vegetables, whole grains, lean proteins, low-fat dairy, and heart-healthy fats.  · Work with your health care provider or diet and nutrition specialist (dietitian) to adjust your eating plan to your individual calorie needs.  This information is not intended to replace advice given to you by your health care provider. Make sure you discuss any questions you have with your health care provider.  Document Released: 07/04/2011 Document Revised: 07/08/2016 Document Reviewed: 07/08/2016  [employer]vier Interactive Patient Education © 2019 [employer]        Preventing Type 2 Diabetes Mellitus  Type 2 diabetes (type 2 diabetes mellitus) is a  long-term (chronic) disease that affects blood sugar (glucose) levels. Normally, a hormone called insulin allows glucose to enter cells in the body. The cells use glucose for energy. In type 2 diabetes, one or both of these problems may be present:  · The body does not make enough insulin.  · The body does not respond properly to insulin that it makes (insulin resistance).  Insulin resistance or lack of insulin causes excess glucose to build up in the blood instead of going into cells. As a result, high blood glucose (hyperglycemia) develops, which can cause many complications. Being overweight or obese and having an inactive (sedentary) lifestyle can increase your risk for diabetes. Type 2 diabetes can be delayed or prevented by making certain nutrition and lifestyle changes.  What nutrition changes can be made?    · Eat healthy meals and snacks regularly. Keep a healthy snack with you for when you get hungry between meals, such as fruit or a handful of nuts.  · Eat lean meats and proteins that are low in saturated fats, such as chicken, fish, egg whites, and beans. Avoid processed meats.  · Eat plenty of fruits and vegetables and plenty of grains that have not been processed (whole grains). It is recommended that you eat:  ? 1½?2 cups of fruit every day.  ? 2½?3 cups of vegetables every day.  ? 6?8 oz of whole grains every day, such as oats, whole wheat, bulgur, brown rice, quinoa, and millet.  · Eat low-fat dairy products, such as milk, yogurt, and cheese.  · Eat foods that contain healthy fats, such as nuts, avocado, olive oil, and canola oil.  · Drink water throughout the day. Avoid drinks that contain added sugar, such as soda or sweet tea.  · Follow instructions from your health care provider about specific eating or drinking restrictions.  · Control how much food you eat at a time (portion size).  ? Check food labels to find out the serving sizes of foods.  ? Use a kitchen scale to weigh amounts of  foods.  · Saute or steam food instead of frying it. Cook with water or broth instead of oils or butter.  · Limit your intake of:  ? Salt (sodium). Have no more than 1 tsp (2,400 mg) of sodium a day. If you have heart disease or high blood pressure, have less than ½?¾ tsp (1,500 mg) of sodium a day.  ? Saturated fat. This is fat that is solid at room temperature, such as butter or fat on meat.  What lifestyle changes can be made?  Activity    ·   Do moderate-intensity physical activity for at least 30 minutes on at least 5 days of the week, or as much as told by your health care provider.  · Ask your health care provider what activities are safe for you. A mix of physical activities may be best, such as walking, swimming, cycling, and strength training.  · Try to add physical activity into your day. For example:  ? Park in spots that are farther away than usual, so that you walk more. For example, park in a far corner of the parking lot when you go to the office or the grocery store.  ? Take a walk during your lunch break.  ? Use stairs instead of elevators or escalators.  Weight Loss  · Lose weight as directed. Your health care provider can determine how much weight loss is best for you and can help you lose weight safely.  · If you are overweight or obese, you may be instructed to lose at least 5?7 % of your body weight.  Alcohol and Tobacco    · Limit alcohol intake to no more than 1 drink a day for nonpregnant women and 2 drinks a day for men. One drink equals 12 oz of beer, 5 oz of wine, or 1½ oz of hard liquor.  · Do not use any tobacco products, such as cigarettes, chewing tobacco, and e-cigarettes. If you need help quitting, ask your health care provider.  Work With Your Health Care Provider  · Have your blood glucose tested regularly, as told by your health care provider.  · Discuss your risk factors and how you can reduce your risk for diabetes.  · Get screening tests as told by your health care provider. You  may have screening tests regularly, especially if you have certain risk factors for type 2 diabetes.  · Make an appointment with a diet and nutrition specialist (registered dietitian). A registered dietitian can help you make a healthy eating plan and can help you understand portion sizes and food labels.  Why are these changes important?  · It is possible to prevent or delay type 2 diabetes and related health problems by making lifestyle and nutrition changes.  · It can be difficult to recognize signs of type 2 diabetes. The best way to avoid possible damage to your body is to take actions to prevent the disease before you develop symptoms.  What can happen if changes are not made?  · Your blood glucose levels may keep increasing. Having high blood glucose for a long time is dangerous. Too much glucose in your blood can damage your blood vessels, heart, kidneys, nerves, and eyes.  · You may develop prediabetes or type 2 diabetes. Type 2 diabetes can lead to many chronic health problems and complications, such as:  ? Heart disease.  ? Stroke.  ? Blindness.  ? Kidney disease.  ? Depression.  ? Poor circulation in the feet and legs, which could lead to surgical removal (amputation) in severe cases.  Where to find support  · Ask your health care provider to recommend a registered dietitian, diabetes educator, or weight loss program.  · Look for local or online weight loss groups.  · Join a gym, fitness club, or outdoor activity group, such as a walking club.  Where to find more information  To learn more about diabetes and diabetes prevention, visit:  · American Diabetes Association (ADA): www.diabetes.org  · National Institute of Diabetes and Digestive and Kidney Diseases: www.niddk.nih.gov/health-information/diabetes  To   learn more about healthy eating, visit:  · The [place]. Department of Agriculture (USDA), Choose My Plate: www.choosemyplate.gov/food-groups  · Office of Disease Prevention and Health Promotion (ODPHP),  Dietary Guidelines: www.health.gov/dietaryguidelines  Summary  · You can reduce your risk for type 2 diabetes by increasing your physical activity, eating healthy foods, and losing weight as directed.  · Talk with your health care provider about your risk for type 2 diabetes. Ask about any blood tests or screening tests that you need to have.  This information is not intended to replace advice given to you by your health care provider. Make sure you discuss any questions you have with your health care provider.  Document Released: 11/06/2015 Document Revised: 06/26/2017 Document Reviewed: 09/05/2015  [employer] Interactive Patient Education © 2019 [employer]

## 2018-09-18 NOTE — Progress Notes (Signed)
Subjective:    Patient ID: Devin Andrews, male    DOB: 1986-01-16, 33 y.o.   MRN: [id number]  HPI       33 year old male new to the practice.  Patient reports past medical history and current medical history significant for hypertension.  Patient however only restarted his medications about a month ago.  Patient also is having issues with urinary frequency as he has to get up anywhere from 2-4 times per night to urinate.  Patient however is taking a fluid pill, chlorthalidone, and patient states that he is taking his blood pressure medication at night.  Patient states that when he took his blood pressure medications in the daytime, before leaving for work between 530 and 6 AM, he would feel as if he was having blurred vision.  Patient states that he restarted blood pressure medications in the past 2 weeks but his fiance who accompanied him to today's visit states that he has been taking the medication about 4 weeks.  Patient does believe that the urinary frequency occurred at about the same time that he switched his blood pressure medications to nighttime.  Patient denies any headaches or dizziness related to his blood pressure.  Patient states that he has had some occasional muscle cramping.  Patient believes that he did have problems with swelling in his lower extremities/lower legs before being started on fluid medication.      Patient reports family history of mother with hypertension and he believes that his father may have high blood pressure as well as his father is on multiple medications but he does not really know what medical conditions his father has but he will find out before his next visit here.  Patient does continue to smoke approximately half pack per day of cigarettes.  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  Patient does have a history of an allergic reaction to a bee sting/wasp sting and patient reports he had onset of hives,  shortness of breath but was able to drive himself to the hospital but passed out in the emergency department.  Patient does not currently have an EpiPen.  Patient does have work-related exposure to bees as he states that bees and snakes are often found within the compartments that contain the meters.      Patient reports that he does have daytime fatigue and patient's fianc also confirms that the patient tends to snore.  Both the patient and fianc have noticed that patient will fall asleep if he is sitting still such as when watching television at night.  Patient also feels at times as if you will fall asleep while driving.  Patient has had no past sleep study and no past diagnosis of sleep apnea.       Patient feels as if he has been gaining weight and has been hungrier than normal.  Patient also reports that in addition to his urinary frequency, occasional blurred vision in the mornings, that he has also had increased thirst.  Patient states that recently no matter how much water he drinks, he always feels thirsty.  Past Medical History:  Diagnosis Date  . Essential hypertension    Past Surgical History:  Procedure Laterality Date  . NO PAST SURGERIES     Family History  Problem Relation Age of Onset  . Diabetes Mother   . Hypertension Mother    Social History   Tobacco Use  . Smoking status: Current  Every Day Smoker    Packs/day: 0.50    Types: Cigarettes  . Smokeless tobacco: Former Engineer, water Use Topics  . Alcohol use: Yes    Comment: occ  . Drug use: No   Allergies  Allergen Reactions  . Bee Venom Other (See Comments)    Passes Out, Dizziness Only "Dirt-Dobbers" - wasps            Review of Systems  Constitutional: Positive for fatigue. Negative for chills and fever.  HENT: Positive for congestion and postnasal drip. Negative for ear pain, hearing loss, rhinorrhea, sore throat and trouble swallowing.   Respiratory: Negative for cough and shortness of breath.     Cardiovascular: Negative for chest pain, palpitations and leg swelling.       Believes he had some leg swelling before being placed on fluid medication  Gastrointestinal: Negative for abdominal pain, blood in stool, constipation, diarrhea and nausea.  Endocrine: Positive for polydipsia, polyphagia and polyuria. Negative for cold intolerance and heat intolerance.  Genitourinary: Positive for frequency. Negative for dysuria, flank pain and hematuria.  Musculoskeletal: Negative for arthralgias, back pain and gait problem.  Neurological: Negative for dizziness and headaches.  Hematological: Negative for adenopathy. Does not bruise/bleed easily.  Psychiatric/Behavioral: Positive for sleep disturbance. Negative for self-injury and suicidal ideas. The patient is not nervous/anxious.        Objective:   Physical Exam BP 138/85 (BP Location: Left Arm, Patient Position: Sitting, Cuff Size: Large)   Pulse 75   Temp 98 F (36.7 C) (Oral)   Resp 18   Ht 6\' 3"  (1.905 m)   Wt (!) 333 lb (151 kg)   SpO2 97%   BMI 41.62 kg/m   Nurse's notes and vital signs reviewed General-well-nourished, well-developed morbidly obese male in no acute distress.  Patient is accompanied by his fiance who is pregnant HEENT- head is atraumatic/normocephalic, extraocular movements are intact and conjunctiva appear normal, patient's right TM is pink and thickened without visible landmarks, left TM is slightly pink but landmarks visible, nares with moderate edema of the nasal turbinates/nasal mucosa with mild clear discharge, patient with narrowed posterior airway/tonsillar arch with mild erythema but patient also with a large tongue base Neck-large neck size, patient with right-sided anterior cervical chain lymphadenopathy No palpable thyromegaly, no carotid bruit Lungs-clear to auscultation bilaterally, breathing is not labored Cardiovascular-regular rate and rhythm Abdomen-truncal obesity, soft and nontender Back-no CVA  tenderness Extremities- patient with some mild distal lower extremity nonpitting edema Psych-normal mood and judgment Neuro-cranial nerves II through XII grossly intact      Assessment & Plan:  1. Essential hypertension BP is reasonably controlled today but would like patient to have blood pressure goal of 130/80 or less.  Complete smoking cessation encouraged.  Low-sodium diet encouraged.  Patient given information on DASH diet as part of AVS.  Patient will have CMP at today's visit.  Patient is currently on amlodipine and chlorthalidone.  Suggested that patient take the chlorthalidone immediately after he arrives home at night to see if this will help to decrease his issues with urinary frequency.  Patient also however reports some blurred vision in addition to urinary frequency and testing will be done to determine if patient may possibly have diabetes as a factor in his urinary frequency and blurred vision.  Patient also aware that a change may be needed in his blood pressure medications the patient does report some increased peripheral edema prior to starting chlorthalidone.  On review of old records,  patient was at one point on losartan 100-HCTZ 25 from a prior provider. - Comprehensive metabolic panel - amLODipine (NORVASC) 5 MG tablet; Take 1 tablet (5 mg total) by mouth daily. To lower blood pressure  Dispense: 30 tablet; Refill: 3 - chlorthalidone (HYGROTON) 25 MG tablet; Take 1 tablet (25 mg total) by mouth daily. To lower blood pressure/decrease swelling  Dispense: 30 tablet; Refill: 3  2. Urinary frequency Patient complains of urinary frequency but is taking the chlorthalidone before bedtime and patient is encouraged to take the medication earlier in the day.  Patient will have hemoglobin A1c and blood sugar checked at today's visit as well as urinalysis and CMP.  Discussed with the patient that diabetes as well as urinary tract infections can cause increased urinary frequency but patient  also is taking a diuretic medication at bedtime which may be part of the issue with his urinary frequency. - HgB A1c - Glucose (CBG) - POCT URINALYSIS DIP (CLINITEK) - Comprehensive metabolic panel  3. Sleep disturbance Patient reports a sleep disturbance and on exam patient has narrowed posterior airway with a large tongue base, large neck size and obesity as well as patient complaint of snoring, daytime fatigue and feeling as if he will fall asleep while driving as well as falling asleep unexpectedly while watching television.  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  Weight loss is encouraged.  Patient will be made aware of the results of the sleep study and if CPAP/BiPAP is indicated based on the results.  Sleep apnea may also be playing a role in patient's hypertension - Split night study; Future  4. Morbid obesity with BMI of 40.0-44.9, adult [facility](HCC) Patient with morbid obesity and he was made aware that this may be contributing to his issues with urinary frequency and hypertension as well as increasing his risk of development of more serious issues such as diabetes, hyperlipidemia and heart disease.  Patient was given information on DASH diet and prevention of type 2 diabetes as patient did have abnormal hemoglobin A1c of 6.3 indicative of prediabetes.  5. Right otitis media, unspecified otitis media type Patient with right otitis media on exam though he is currently asymptomatic.  Patient also with nasal congestion.  Patient is encouraged to take over-the-counter medication to help with his congestion but avoid the use of decongestants which could further raise his blood pressure.  If patient has onset of ear pain, prescription has been sent to his pharmacy for antibiotic, amoxicillin 875 mg twice daily x7 days. - amoxicillin (AMOXIL) 875 MG  tablet; Take 1 tablet (875 mg total) by mouth 2 (two) times daily for 7 days. For treatment of ear infection.  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  Prescription sent to patient's pharmacy for EpiPen. - EPINEPHrine (EPIPEN 2-PAK) 0.3 mg/0.3 mL IJ SOAJ injection; Inject 0.3 mLs (0.3 mg total) into the muscle as needed for anaphylaxis. Severe allergic reaction/bee stings  Dispense: 1 Device; Refill: 11  7. Prediabetes Patient with hemoglobin A1c of 6.3 and patient and fianc were given handouts regarding diabetes, diabetes medications and hemoglobin A1c and pathophysiology of diabetes and insulin resistance discussed with the patient.  Patient agrees to start metformin  500 mg 2 pills after the evening meal but patient made aware that he can start 1 pill after the evening meal x1 week then increase to 2 pills.  Patient also made aware that he may have some issues with stomach upset/nausea or diarrhea/loose stools when he initially starts metformin but that the symptoms tend to resolve.  Patient will return to clinic in the next 3 weeks for further evaluation.  Weight loss and dietary changes recommended and handout provided on DASH diet and how to prevent diabetes as part of patient's AVS - metFORMIN (GLUCOPHAGE) 500 MG tablet; Take 2 pills daily after the last meal of the day  Dispense: 60 tablet; Refill: 3  An After Visit Summary was printed and given to the patient. Allergies as of 09/18/2018      Reactions   Bee Venom Other (See Comments)   Passes Out, Dizziness Only "Dirt-Dobbers" - wasps      Medication List       Accurate as of September 18, 2018  6:23 PM. Always use your most recent med list.        amLODipine 5 MG tablet Commonly known as:  NORVASC Take 1 tablet (5 mg total) by mouth daily. To lower blood pressure   amoxicillin 875 MG  tablet Commonly known as:  AMOXIL Take 1 tablet (875 mg total) by mouth 2 (two) times daily for 7 days. For treatment of ear infection.  Take medication after eating   chlorthalidone 25 MG tablet Commonly known as:  HYGROTON Take 1 tablet (25 mg total) by mouth daily. To lower blood pressure/decrease swelling   EPINEPHrine 0.3 mg/0.3 mL Soaj injection Commonly known as:  EPIPEN 2-PAK Inject 0.3 mLs (0.3 mg total) into the muscle as needed for anaphylaxis. Severe allergic reaction/bee stings   metFORMIN 500 MG tablet Commonly known as:  GLUCOPHAGE Take 2 pills daily after the last meal of the day      Return in about 3 weeks (around 10/09/2018) for lab/med follow-up.  An After Visit Summary was printed and given to the patient.  Return in about 3 weeks (around 10/09/2018) for lab/med follow-up.

## 2018-09-19 LAB — COMPREHENSIVE METABOLIC PANEL WITH GFR
ALT: 51 IU/L — ABNORMAL HIGH (ref 0–44)
AST: 38 IU/L (ref 0–40)
Albumin/Globulin Ratio: 1.6 (ref 1.2–2.2)
Albumin: 4.3 g/dL (ref 4.0–5.0)
Alkaline Phosphatase: 115 IU/L (ref 39–117)
BUN/Creatinine Ratio: 14 (ref 9–20)
BUN: 15 mg/dL (ref 6–20)
Bilirubin Total: <0.2 mg/dL (ref 0.0–1.2)
CO2: 24 mmol/L (ref 20–29)
Calcium: 9.7 mg/dL (ref 8.7–10.2)
Chloride: 100 mmol/L (ref 96–106)
Creatinine, Ser: 1.07 mg/dL (ref 0.76–1.27)
GFR calc Af Amer: 105 mL/min/1.73
GFR calc non Af Amer: 91 mL/min/1.73
Globulin, Total: 2.7 g/dL (ref 1.5–4.5)
Glucose: 122 mg/dL — ABNORMAL HIGH (ref 65–99)
Potassium: 4.1 mmol/L (ref 3.5–5.2)
Sodium: 140 mmol/L (ref 134–144)
Total Protein: 7 g/dL (ref 6.0–8.5)

## 2018-09-22 ENCOUNTER — Telehealth: Payer: Self-pay | Admitting: *Deleted

## 2018-09-22 NOTE — Telephone Encounter (Signed)
Patient verified DOB Patient is aware of labs being normal except for liver enzyme and sugar level. Advised to avoid alcohol and tylenol and eliminate sugar. Patient will keep his follow up appt.

## 2018-09-22 NOTE — Telephone Encounter (Signed)
-----   Message from Cain Saupe, MD sent at 09/19/2018 10:19 PM EST ----- Please notify patient that his blood sugar was 122 on the blood work done at his recent visit and his AST was mildly elevated at 51 (normal 0-44). This can be caused by multiple things including alcohol use, medications such as Tylenol that may irritate the liver or a high fat diet/increase fatty tissue in the liver. Keep upcoming appointment

## 2018-10-22 ENCOUNTER — Telehealth: Payer: Self-pay | Admitting: General Practice

## 2018-10-22 ENCOUNTER — Other Ambulatory Visit: Payer: Self-pay | Admitting: Family Medicine

## 2018-10-22 DIAGNOSIS — Z6841 Body Mass Index (BMI) 40.0 and over, adult: Secondary | ICD-10-CM

## 2018-10-22 DIAGNOSIS — G479 Sleep disorder, unspecified: Secondary | ICD-10-CM

## 2018-10-22 NOTE — Progress Notes (Signed)
Patient ID: Devin Andrews, male   DOB: 1986-04-16, 33 y.o.   MRN: [id number]  Patient's insurance will only cover a home sleep study therefore order placed for home sleep study

## 2018-10-22 NOTE — Telephone Encounter (Signed)
MA unable to reach patient due to busy signal ringing times 3 tries. Patient appt has been rescheduled and patients order for home sleep study.

## 2018-10-22 NOTE — Telephone Encounter (Signed)
Order was placed for a home sleep study

## 2018-10-22 NOTE — Telephone Encounter (Signed)
Please place an order for home sleep study due to insurance only covering this type of study

## 2018-10-22 NOTE — Telephone Encounter (Signed)
Terri with the sleep center states that the patient has [employer] and that they have denied the in-lab sleep study but will approve a home sleep study but will need a new order for the home sleep study. Please follow up.

## 2018-10-23 ENCOUNTER — Ambulatory Visit: Payer: BLUE CROSS/BLUE SHIELD | Admitting: Family Medicine

## 2020-12-06 ENCOUNTER — Encounter (HOSPITAL_COMMUNITY): Payer: Self-pay

## 2020-12-06 ENCOUNTER — Other Ambulatory Visit: Payer: Self-pay

## 2020-12-06 ENCOUNTER — Ambulatory Visit (HOSPITAL_COMMUNITY)
Admission: EM | Admit: 2020-12-06 | Discharge: 2020-12-06 | Disposition: A | Discharge status: Home / Self Care | Payer: Self-pay | Attending: Physician Assistant | Admitting: Physician Assistant

## 2020-12-06 DIAGNOSIS — Z87892 Personal history of anaphylaxis: Secondary | ICD-10-CM | POA: Insufficient documentation

## 2020-12-06 DIAGNOSIS — Z91030 Bee allergy status: Secondary | ICD-10-CM | POA: Insufficient documentation

## 2020-12-06 DIAGNOSIS — I10 Essential (primary) hypertension: Secondary | ICD-10-CM | POA: Insufficient documentation

## 2020-12-06 DIAGNOSIS — S0501XA Injury of conjunctiva and corneal abrasion without foreign body, right eye, initial encounter: Secondary | ICD-10-CM | POA: Insufficient documentation

## 2020-12-06 DIAGNOSIS — Z760 Encounter for issue of repeat prescription: Secondary | ICD-10-CM | POA: Insufficient documentation

## 2020-12-06 LAB — COMPREHENSIVE METABOLIC PANEL
ALT: 53 U/L — ABNORMAL HIGH (ref 0–44)
AST: 47 U/L — ABNORMAL HIGH (ref 15–41)
Albumin: 4 g/dL (ref 3.5–5.0)
Alkaline Phosphatase: 99 U/L (ref 38–126)
Anion gap: 7 (ref 5–15)
BUN: 11 mg/dL (ref 6–20)
CO2: 27 mmol/L (ref 22–32)
Calcium: 9 mg/dL (ref 8.9–10.3)
Chloride: 103 mmol/L (ref 98–111)
Creatinine, Ser: 1.07 mg/dL (ref 0.61–1.24)
GFR, Estimated: >60 mL/min (ref >60)
Glucose, Bld: 133 mg/dL — ABNORMAL HIGH (ref 70–99)
Potassium: 4 mmol/L (ref 3.5–5.1)
Sodium: 137 mmol/L (ref 135–145)
Total Bilirubin: 0.7 mg/dL (ref 0.3–1.2)
Total Protein: 7.2 g/dL (ref 6.5–8.1)

## 2020-12-06 LAB — CBC
HCT: 43.4 % (ref 39.0–52.0)
Hemoglobin: 13.4 g/dL (ref 13.0–17.0)
MCH: 26.1 pg (ref 26.0–34.0)
MCHC: 30.9 g/dL (ref 30.0–36.0)
MCV: 84.6 fL (ref 80.0–100.0)
Platelets: 207 10*3/uL (ref 150–400)
RBC: 5.13 MIL/uL (ref 4.22–5.81)
RDW: 15.9 % — ABNORMAL HIGH (ref 11.5–15.5)
WBC: 9.6 10*3/uL (ref 4.0–10.5)
nRBC: 0 % (ref 0.0–0.2)

## 2020-12-06 MED ORDER — CHLORTHALIDONE 25 MG PO TABS: 25.0000 mg | ORAL_TABLET | Freq: Every day | ORAL | 0 refills | Status: DC

## 2020-12-06 MED ORDER — ERYTHROMYCIN 5 MG/GM OP OINT: TOPICAL_OINTMENT | OPHTHALMIC | 0 refills | Status: DC

## 2020-12-06 MED ORDER — FLUORESCEIN SODIUM 1 MG OP STRP
ORAL_STRIP | OPHTHALMIC | Status: AC
  Filled 2020-12-06: qty 1

## 2020-12-06 MED ORDER — TETRACAINE HCL 0.5 % OP SOLN
OPHTHALMIC | Status: AC
  Filled 2020-12-06: qty 4

## 2020-12-06 MED ORDER — EPINEPHRINE 0.3 MG/0.3ML IJ SOAJ: 0.3000 mg | INTRAMUSCULAR | 0 refills | Status: AC | PRN

## 2020-12-06 MED ORDER — AMLODIPINE BESYLATE 5 MG PO TABS: 5.0000 mg | ORAL_TABLET | Freq: Every day | ORAL | 0 refills | Status: DC

## 2020-12-06 NOTE — ED Provider Notes (Signed)
[facility]    CSN: [id number] Arrival date & time: 12/06/20  0802      History   Chief Complaint Chief Complaint  Patient presents with  . Eye irritation  . Medication Refill    HPI Devin Andrews is a 35 y.o. male.   Patient presents today with a 2-day history of right eye irritation.  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  He was not wearing safety goggles but has been doing so since injury.  He no longer has foreign body sensation but was experiencing this yesterday.  He has tried over-the-counter red relief drops without improvement of symptoms.  He does not wear contacts.  Denies previous eye injury.  He does not see an ophthalmologist.  Denies changes to vision, ocular pain, headache, nausea, vomiting.  In addition, patient has been lost to follow-up and has not had his blood pressure medication in several years.  His blood pressure was noted to be elevated today but he denies any symptoms including chest pain, shortness of breath, peripheral edema, headache, visual disturbance.  He is monitoring his diet for salt remains physically active at work.  He is open to restarting medication today.  He is open to establishing with local PCP.  In addition, patient does not have an EpiPen available.  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  He denies any recent stings or EpiPen use.  He is open to prescription to have this updated and have on hand.     Past Medical History:  Diagnosis Date  . Essential hypertension     Patient Active Problem List   Diagnosis Date Noted  . Tobacco dependence 09/18/2018  . Morbid obesity due to excess calories (HCC) 09/18/2017  . Essential hypertension 10/10/2014    Past Surgical History:  Procedure Laterality Date  . NO PAST SURGERIES         Home Medications    Prior to Admission medications   Medication Sig Start Date End Date Taking? Authorizing Provider   erythromycin ophthalmic ointment Place a 1/2 inch ribbon of ointment into the lower eyelid. 12/06/20  Yes Macdonald Rigor K, PA-C  amLODipine (NORVASC) 5 MG tablet Take 1 tablet (5 mg total) by mouth daily. To lower blood pressure 12/06/20   Tiarra Anastacio K, PA-C  chlorthalidone (HYGROTON) 25 MG tablet Take 1 tablet (25 mg total) by mouth daily. To lower blood pressure/decrease swelling 12/06/20   Elisea Khader K, PA-C  EPINEPHrine (EPIPEN 2-PAK) 0.3 mg/0.3 mL IJ SOAJ injection Inject 0.3 mg into the muscle as needed for anaphylaxis. Severe allergic reaction/bee stings 12/06/20   Carsyn Taubman K, PA-C  metFORMIN (GLUCOPHAGE) 500 MG tablet Take 2 pills daily after the last meal of the day 09/18/18   Cain Saupe, MD    Family History Family History  Problem Relation Age of Onset  . Diabetes Mother   . Hypertension Mother     Social History Social History   Tobacco Use  . Smoking status: Current Every Day Smoker    Packs/day: 0.50    Types: Cigarettes  . Smokeless tobacco: Former Engineer, water Use Topics  . Alcohol use: Yes    Comment: occ  . Drug use: No     Allergies   Bee venom   Review of Systems Review of Systems  Constitutional: Negative for activity change, appetite change, fatigue and fever.  Eyes: Positive for pain, discharge, redness and itching.  Negative for photophobia and visual disturbance.  Respiratory: Negative for cough and shortness of breath.   Cardiovascular: Negative for chest pain and palpitations.  Gastrointestinal: Negative for abdominal pain, diarrhea, nausea and vomiting.  Musculoskeletal: Negative for arthralgias and myalgias.  Neurological: Negative for dizziness, weakness, light-headedness, numbness and headaches.     Physical Exam Triage Vital Signs ED Triage Vitals  Enc Vitals Group     BP 12/06/20 0822 (S) (!) 174/119     Pulse Rate 12/06/20 0822 64     Resp 12/06/20 0822 18     Temp 12/06/20 0822 98 F (36.7 C)     Temp src --      SpO2  12/06/20 0822 100 %     Weight --      Height --      Head Circumference --      Peak Flow --      Pain Score 12/06/20 0817 0     Pain Loc --      Pain Edu? --      Excl. in GC? --    No data found.  Updated Vital Signs BP (!) 181/122   Pulse 64   Temp 98 F (36.7 C)   Resp 18   SpO2 100%   Visual Acuity Right Eye Distance: 20/25 (20 ft) Left Eye Distance: 20/20 (20 ft) Bilateral Distance:    Right Eye Near:   Left Eye Near:    Bilateral Near:     Physical Exam Vitals reviewed.  Constitutional:      General: He is awake.     Appearance: Normal appearance. He is normal weight. He is not ill-appearing.     Comments: Very pleasant male appears stated age in no acute distress  HENT:     Head: Normocephalic and atraumatic.     Nose: Nose normal.     Mouth/Throat:     Pharynx: Uvula midline. No oropharyngeal exudate or posterior oropharyngeal erythema.  Eyes:     General: Lids are everted, no foreign bodies appreciated.     Extraocular Movements: Extraocular movements intact.     Conjunctiva/sclera:     Right eye: Right conjunctiva is injected. No exudate or hemorrhage.    Left eye: Left conjunctiva is not injected.     Pupils: Pupils are equal, round, and reactive to light.     Right eye: Corneal abrasion and fluorescein uptake present.     Comments: Resolution of pain with tetracaine use.  Cardiovascular:     Rate and Rhythm: Normal rate and regular rhythm.     Heart sounds: No murmur heard.   Pulmonary:     Effort: Pulmonary effort is normal.     Breath sounds: Normal breath sounds. No stridor. No wheezing, rhonchi or rales.  Abdominal:     Palpations: Abdomen is soft.     Tenderness: There is no abdominal tenderness.  Musculoskeletal:     Right lower leg: No edema.     Left lower leg: No edema.  Neurological:     Mental Status: He is alert.  Psychiatric:        Behavior: Behavior is cooperative.      UC Treatments / Results  Labs (all labs  ordered are listed, but only abnormal results are displayed) Labs Reviewed  CBC  COMPREHENSIVE METABOLIC PANEL    EKG   Radiology No results found.  Procedures Procedures (including critical care time)  Medications Ordered in UC Medications - No data to display  Initial Impression / Assessment and Plan / UC Course  I have reviewed the triage vital signs and the nursing notes.  Pertinent labs & imaging results that were available during my care of the patient were reviewed by me and considered in my medical decision making (see chart for details).     Corneal abrasion noted on exam.  No foreign body noted with eversion of lids.  Patient was prescribed erythromycin and instructed to apply this daily.  Given severity of abrasion he was encouraged to follow-up with ophthalmology and given contact information as part of after visit summary.  He was instructed to go directly to the emergency room with any persistent ocular pain or vision changes.  Patient was restarted on blood pressure medication.  CBC and CMP obtained today.  He was encouraged to limit salt in diet and continue with regular physical activity.  Discussed that if he has any worsening symptoms including headache, dizziness, chest pain, shortness of breath he is to go to the emergency room.  He is to follow-up with someone within 1 week; we will try to establish with a PCP but if he is unable to be seen in this timeframe by primary care would need to return to office for blood pressure recheck.  Patient given new prescription for EpiPen.  Discussed if he requires use of this he is to go to the emergency room.  Final Clinical Impressions(s) / UC Diagnoses   Final diagnoses:  Abrasion of right cornea, initial encounter  Essential hypertension  Encounter for medication refill  History of anaphylaxis     Discharge Instructions     Call ophthalmology and schedule an appointment.  If you have any worsening symptoms  including changes to your vision or ocular pain you need to go to the emergency room.  Use erythromycin daily.  This can make your vision blurred 20 minutes after using it so use it at night.  You can use lubricating eyedrops for additional symptom relief.  Start blood pressure medication.  You need to see someone within 1 week to recheck your blood pressure so if you cannot see a PCP in this timeframe please come back to clinic.  If you have any worsening symptoms you need to go to the emergency room.  If you need to use your EpiPen you need to go to the emergency room.    ED Prescriptions    Medication Sig Dispense Auth. Provider   amLODipine (NORVASC) 5 MG tablet Take 1 tablet (5 mg total) by mouth daily. To lower blood pressure 30 tablet Oluwatobi Ruppe K, PA-C   chlorthalidone (HYGROTON) 25 MG tablet Take 1 tablet (25 mg total) by mouth daily. To lower blood pressure/decrease swelling 30 tablet Latayvia Mandujano K, PA-C   EPINEPHrine (EPIPEN 2-PAK) 0.3 mg/0.3 mL IJ SOAJ injection Inject 0.3 mg into the muscle as needed for anaphylaxis. Severe allergic reaction/bee stings 1 each Brendolyn Stockley K, PA-C   erythromycin ophthalmic ointment Place a 1/2 inch ribbon of ointment into the lower eyelid. 3.5 g Audre Cenci K, PA-C     PDMP not reviewed this encounter.   Jeani Hawking, PA-C 12/06/20 0915

## 2020-12-06 NOTE — Discharge Instructions (Signed)
Call ophthalmology and schedule an appointment.  If you have any worsening symptoms including changes to your vision or ocular pain you need to go to the emergency room.  Use erythromycin daily.  This can make your vision blurred 20 minutes after using it so use it at night.  You can use lubricating eyedrops for additional symptom relief.  Start blood pressure medication.  You need to see someone within 1 week to recheck your blood pressure so if you cannot see a PCP in this timeframe please come back to clinic.  If you have any worsening symptoms you need to go to the emergency room.  If you need to use your EpiPen you need to go to the emergency room.

## 2020-12-06 NOTE — ED Triage Notes (Addendum)
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 Pt states it feels like something is in his eye but is unable to find it. States does not affect his vision. 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   Pt would also like refills of home meds ordered as he states he has not been able to see his primary care to get them filled. Pt reports he never picked up his epi pen due to price of epi pen. Explained importance of picking up epi pen and pt verbalized understanding.

## 2020-12-08 ENCOUNTER — Encounter: Payer: Self-pay | Admitting: *Deleted

## 2020-12-26 ENCOUNTER — Encounter (INDEPENDENT_AMBULATORY_CARE_PROVIDER_SITE_OTHER): Payer: Self-pay

## 2021-02-13 ENCOUNTER — Ambulatory Visit: Payer: Self-pay | Admitting: Critical Care Medicine

## 2021-02-13 NOTE — Progress Notes (Deleted)
New Patient Office Visit  Subjective:  Patient ID: Devin Andrews, male    DOB: 07-Oct-1985  Age: 35 y.o. MRN: [id number]  CC: former [facility] patient   HPI [name] presents for *** Last seen here 08/2018 with [facility] Essential hypertension BP is reasonably controlled today but would like patient to have blood pressure goal of 130/80 or less.  Complete smoking cessation encouraged.  Low-sodium diet encouraged.  Patient given information on DASH diet as part of AVS.  Patient will have CMP at today's visit.  Patient is currently on amlodipine and chlorthalidone.  Suggested that patient take the chlorthalidone immediately after he arrives home at night to see if this will help to decrease his issues with urinary frequency.  Patient also however reports some blurred vision in addition to urinary frequency and testing will be done to determine if patient may possibly have diabetes as a factor in his urinary frequency and blurred vision.  Patient also aware that a change may be needed in his blood pressure medications the patient does report some increased peripheral edema prior to starting chlorthalidone.  On review of old records, patient was at one point on losartan 100-HCTZ 25 from a prior provider. - Comprehensive metabolic panel - amLODipine (NORVASC) 5 MG tablet; Take 1 tablet (5 mg total) by mouth daily. To lower blood pressure  Dispense: 30 tablet; Refill: 3 - chlorthalidone (HYGROTON) 25 MG tablet; Take 1 tablet (25 mg total) by mouth daily. To lower blood pressure/decrease swelling  Dispense: 30 tablet; Refill: 3   2. Urinary frequency Patient complains of urinary frequency but is taking the chlorthalidone before bedtime and patient is encouraged to take the medication earlier in the day.  Patient will have hemoglobin A1c and blood sugar checked at today's visit as well as urinalysis and CMP.  Discussed with the patient that diabetes as well as urinary tract infections can cause  increased urinary frequency but patient also is taking a diuretic medication at bedtime which may be part of the issue with his urinary frequency. - HgB A1c - Glucose (CBG) - POCT URINALYSIS DIP (CLINITEK) - Comprehensive metabolic panel   3. Sleep disturbance Patient reports a sleep disturbance and on exam patient has narrowed posterior airway with a large tongue base, large neck size and obesity as well as patient complaint of snoring, daytime fatigue and feeling as if he will fall asleep while driving as well as falling asleep unexpectedly while watching television.  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  Weight loss is encouraged.  Patient will be made aware of the results of the sleep study and if CPAP/BiPAP is indicated based on the results.  Sleep apnea may also be playing a role in patient's hypertension - Split night study; Future   4. Morbid obesity with BMI of 40.0-44.9, adult [facility]) Patient with morbid obesity and he was made aware that this may be contributing to his issues with urinary frequency and hypertension as well as increasing his risk of development of more serious issues such as diabetes, hyperlipidemia and heart disease.  Patient was given information on DASH diet and prevention of type 2 diabetes as patient did have abnormal hemoglobin A1c of 6.3 indicative of prediabetes.   5. Right otitis media, unspecified otitis media type Patient with right otitis media on exam though he  is currently asymptomatic.  Patient also with nasal congestion.  Patient is encouraged to take over-the-counter medication to help with his congestion but avoid the use of decongestants which could further raise his blood pressure.  If patient has onset of ear pain, prescription has been sent to his pharmacy for antibiotic, amoxicillin 875 mg twice daily x7  days. - amoxicillin (AMOXIL) 875 MG tablet; Take 1 tablet (875 mg total) by mouth 2 (two) times daily for 7 days. For treatment of ear infection.  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  Prescription sent to patient's pharmacy for EpiPen. - EPINEPHrine (EPIPEN 2-PAK) 0.3 mg/0.3 mL IJ SOAJ injection; Inject 0.3 mLs (0.3 mg total) into the muscle as needed for anaphylaxis. Severe allergic reaction/bee stings  Dispense: 1 Device; Refill: 11   7. Prediabetes Patient with hemoglobin A1c of 6.3 and patient and fianc were given handouts regarding diabetes, diabetes medications and hemoglobin A1c and pathophysiology of diabetes and insulin resistance discussed with the patient.  Patient agrees to start metformin 500 mg 2 pills after the evening meal but patient made aware that he can start 1 pill after the evening meal x1 week then increase to 2 pills.  Patient also made aware that he may have some issues with stomach upset/nausea or diarrhea/loose stools when he initially starts metformin but that the symptoms tend to resolve.  Patient will return to clinic in the next 3 weeks for further evaluation.  Weight loss and dietary changes recommended and handout provided on DASH diet and how to prevent diabetes as part of patient's AVS - metFORMIN (GLUCOPHAGE) 500 MG tablet; Take 2 pills daily after the last meal of the day  Dispense: 60 tablet; Refill: 3 Past Medical History:  Diagnosis Date   Essential hypertension     Past Surgical History:  Procedure Laterality Date   NO PAST SURGERIES      Family History  Problem Relation Age of Onset   Diabetes Mother    Hypertension Mother     Social History   Socioeconomic History   Marital status: Single    Spouse name: Not on file   Number of children: Not on file   Years of education: Not on file   Highest  education level: Not on file  Occupational History   Not on file  Tobacco Use   Smoking status: Every Day    Packs/day: 0.50    Types: Cigarettes   Smokeless tobacco: Former  Substance and Sexual Activity   Alcohol use: Yes    Comment: occ   Drug use: No   Sexual activity: Yes  Other Topics Concern   Not on file  Social History Narrative   Spouse Dorene Grebe is pregnant currently 09/18/2018   Social Determinants of Health   Financial Resource Strain: Not on file  Food Insecurity: Not on file  Transportation Needs: Not on file  Physical Activity: Not on file  Stress: Not on file  Social Connections: Not on file  Intimate Partner Violence: Not on file    ROS Review of Systems  Objective:   Today's Vitals: There were no vitals taken for this visit.  Physical Exam  Assessment & Plan:   Problem List Items Addressed This Visit   None   Outpatient Encounter Medications as of 02/13/2021  Medication Sig   amLODipine (NORVASC) 5 MG tablet  Take 1 tablet (5 mg total) by mouth daily. To lower blood pressure   chlorthalidone (HYGROTON) 25 MG tablet Take 1 tablet (25 mg total) by mouth daily. To lower blood pressure/decrease swelling   EPINEPHrine (EPIPEN 2-PAK) 0.3 mg/0.3 mL IJ SOAJ injection Inject 0.3 mg into the muscle as needed for anaphylaxis. Severe allergic reaction/bee stings   erythromycin ophthalmic ointment Place a 1/2 inch ribbon of ointment into the lower eyelid.   metFORMIN (GLUCOPHAGE) 500 MG tablet Take 2 pills daily after the last meal of the day   No facility-administered encounter medications on file as of 02/13/2021.    Follow-up: No follow-ups on file.   Shan Levans, MD

## 2021-08-16 ENCOUNTER — Emergency Department (HOSPITAL_COMMUNITY): Payer: No Typology Code available for payment source

## 2021-08-16 ENCOUNTER — Emergency Department (HOSPITAL_COMMUNITY)
Admission: EM | Admit: 2021-08-16 | Discharge: 2021-08-17 | Discharge status: Against Medical Advice | Payer: No Typology Code available for payment source | Attending: Student | Admitting: Student

## 2021-08-16 ENCOUNTER — Other Ambulatory Visit: Payer: Self-pay

## 2021-08-16 DIAGNOSIS — Y92410 Unspecified street and highway as the place of occurrence of the external cause: Secondary | ICD-10-CM | POA: Insufficient documentation

## 2021-08-16 DIAGNOSIS — Z5321 Procedure and treatment not carried out due to patient leaving prior to being seen by health care provider: Secondary | ICD-10-CM | POA: Diagnosis not present

## 2021-08-16 DIAGNOSIS — R519 Headache, unspecified: Secondary | ICD-10-CM | POA: Diagnosis not present

## 2021-08-16 DIAGNOSIS — H538 Other visual disturbances: Secondary | ICD-10-CM | POA: Insufficient documentation

## 2021-08-16 DIAGNOSIS — M25511 Pain in right shoulder: Secondary | ICD-10-CM | POA: Diagnosis not present

## 2021-08-16 DIAGNOSIS — R110 Nausea: Secondary | ICD-10-CM | POA: Diagnosis not present
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
## 2021-08-17 ENCOUNTER — Emergency Department
Admission: EM | Admit: 2021-08-17 | Discharge: 2021-08-17 | Discharge status: Against Medical Advice | Payer: Self-pay | Attending: Emergency Medicine | Admitting: Emergency Medicine

## 2021-08-17 ENCOUNTER — Emergency Department: Payer: Self-pay

## 2021-08-17 ENCOUNTER — Encounter: Payer: Self-pay | Admitting: Emergency Medicine

## 2021-08-17 DIAGNOSIS — S0993XA Unspecified injury of face, initial encounter: Secondary | ICD-10-CM | POA: Insufficient documentation

## 2021-08-17 DIAGNOSIS — Y92410 Unspecified street and highway as the place of occurrence of the external cause: Secondary | ICD-10-CM | POA: Insufficient documentation

## 2021-08-17 DIAGNOSIS — S0990XA Unspecified injury of head, initial encounter: Secondary | ICD-10-CM | POA: Insufficient documentation

## 2021-08-17 DIAGNOSIS — I10 Essential (primary) hypertension: Secondary | ICD-10-CM | POA: Insufficient documentation

## 2021-08-17 DIAGNOSIS — Z5321 Procedure and treatment not carried out due to patient leaving prior to being seen by health care provider: Secondary | ICD-10-CM | POA: Insufficient documentation

## 2021-08-17 NOTE — ED Notes (Signed)
LWBS 

## 2021-08-17 NOTE — ED Notes (Signed)
CT attempted x2 to bring pt for imaging, unable to locate pt
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
## 2021-09-24 ENCOUNTER — Encounter: Payer: Self-pay | Admitting: Internal Medicine

## 2021-10-25 ENCOUNTER — Ambulatory Visit: Payer: Managed Care, Other (non HMO) | Attending: Internal Medicine | Admitting: Internal Medicine

## 2021-10-25 ENCOUNTER — Encounter: Payer: Self-pay | Admitting: Internal Medicine

## 2021-10-25 ENCOUNTER — Other Ambulatory Visit: Payer: Self-pay

## 2021-10-25 VITALS — BP 197/140 | HR 67 | Resp 16 | Ht 75.0 in | Wt 312.6 lb

## 2021-10-25 DIAGNOSIS — R7989 Other specified abnormal findings of blood chemistry: Secondary | ICD-10-CM

## 2021-10-25 DIAGNOSIS — I10 Essential (primary) hypertension: Secondary | ICD-10-CM

## 2021-10-25 DIAGNOSIS — E1169 Type 2 diabetes mellitus with other specified complication: Secondary | ICD-10-CM

## 2021-10-25 DIAGNOSIS — Z7689 Persons encountering health services in other specified circumstances: Secondary | ICD-10-CM

## 2021-10-25 DIAGNOSIS — E669 Obesity, unspecified: Secondary | ICD-10-CM

## 2021-10-25 DIAGNOSIS — F17200 Nicotine dependence, unspecified, uncomplicated: Secondary | ICD-10-CM

## 2021-10-25 DIAGNOSIS — F320 Major depressive disorder, single episode, mild: Secondary | ICD-10-CM

## 2021-10-25 LAB — POCT GLYCOSYLATED HEMOGLOBIN (HGB A1C): HbA1c, POC (controlled diabetic range): 7.7 % — AB (ref 0.0–7.0)

## 2021-10-25 MED ORDER — AMLODIPINE BESYLATE 5 MG PO TABS
5.0000 mg | ORAL_TABLET | Freq: Every day | ORAL | 1 refills | Status: DC
  Filled 2021-10-25: qty 30, 30d supply, fill #0
  Filled 2021-11-30: qty 30, 30d supply, fill #1

## 2021-10-25 MED ORDER — NICOTINE 21 MG/24HR TD PT24
21.0000 mg | MEDICATED_PATCH | Freq: Every day | TRANSDERMAL | 1 refills | Status: DC
  Filled 2021-10-25: qty 28, 28d supply, fill #0
  Filled 2021-11-30: qty 28, 28d supply, fill #1

## 2021-10-25 MED ORDER — HYDRALAZINE HCL 10 MG PO TABS
25.0000 mg | ORAL_TABLET | Freq: Once | ORAL | Status: AC
  Administered 2021-10-25: 25 mg via ORAL

## 2021-10-25 MED ORDER — VALSARTAN 40 MG PO TABS
40.0000 mg | ORAL_TABLET | Freq: Every day | ORAL | 3 refills | Status: DC
  Filled 2021-10-25: qty 30, 30d supply, fill #0
  Filled 2021-11-30: qty 30, 30d supply, fill #1
  Filled 2022-01-04: qty 30, 30d supply, fill #2

## 2021-10-25 MED ORDER — METFORMIN HCL 500 MG PO TABS
500.0000 mg | ORAL_TABLET | Freq: Every day | ORAL | 1 refills | Status: DC
  Filled 2021-10-25: qty 30, 30d supply, fill #0
  Filled 2021-11-30: qty 30, 30d supply, fill #1
  Filled 2022-01-04 – 2022-01-15 (×2): qty 30, 30d supply, fill #2
  Filled 2022-02-25: qty 30, 30d supply, fill #3
  Filled 2022-04-04: qty 30, 30d supply, fill #4

## 2021-10-25 MED ORDER — SERTRALINE HCL 25 MG PO TABS
25.0000 mg | ORAL_TABLET | Freq: Every day | ORAL | 3 refills | Status: DC
  Filled 2021-10-25: qty 30, 30d supply, fill #0
  Filled 2021-11-30: qty 30, 30d supply, fill #1
  Filled 2022-01-04 – 2022-01-15 (×3): qty 30, 30d supply, fill #2
  Filled 2022-02-25: qty 30, 30d supply, fill #3

## 2021-10-25 NOTE — Progress Notes (Signed)
? ? ?Patient ID: Devin Andrews, male    DOB: 06/10/1986  MRN: [id number] ? ?CC: New Patient (Initial Visit), Hypertension (Lost insurance and has not had any medications ), and Prediabetes (A1C-7.7) ? ? ?Subjective: ?Devin Andrews is a 36 y.o. male who presents for chronic disease management.  Previous PCP is Dr. Jillyn HiddenFulp who is no longer with the practice ?His concerns today include:  ?Patient with history of HTN, pre-DM, tob dep,  obesity, allergy to bee sting ? ?HTN: ?Off med x 1 yr due to loss of insurance.  Previously on Norvasc and chlorthalidone. ?Denies CP/SOB/LE edema/blurred vision ?Has a device but not checking BP regularly ?Father recently died of CVA from BP so we decided to follow-up on his health. ?Smokes 1 pk/day currently.  Smoked since age 36 and has been 1 pk/day for past 8 yrs. Tried unsuccessful to quit in past.  He is ready to quit. ? ?Hx of PreDM: ?A1C today is 7.7 in range for DM.  Strong Hx of DM in mom and sisters. ?+blurred vision. Endorses increase thirst and polyuria ? ?Mild LFT elev last yr with AST 47/ALT 53 ?Drinks ETOH occasionally.  ? ?Pos Dep screen:  worried about things and still grieving the loss of his father.[place].  Feels like he wants to cry sometimes but keeps it to himself.  No SI.  Feels he would benefit from med ? ?Patient Active Problem List  ? Diagnosis Date Noted  ? Tobacco dependence 09/18/2018  ? Morbid obesity due to excess calories (HCC) 09/18/2017  ? Essential hypertension 10/10/2014  ?  ? ?Current Outpatient Medications on File Prior to Visit  ?Medication Sig Dispense Refill  ? chlorthalidone (HYGROTON) 25 MG tablet Take 1 tablet (25 mg total) by mouth daily. To lower blood pressure/decrease swelling (Patient not taking: Reported on 10/25/2021) 30 tablet 0  ? EPINEPHrine (EPIPEN 2-PAK) 0.3 mg/0.3 mL IJ SOAJ injection Inject 0.3 mg into the muscle as needed for anaphylaxis. Severe allergic reaction/bee stings (Patient not taking: Reported on 10/25/2021) 1 each 0  ?  erythromycin ophthalmic ointment Place a 1/2 inch ribbon of ointment into the lower eyelid. (Patient not taking: Reported on 10/25/2021) 3.5 g 0  ? ?No current facility-administered medications on file prior to visit.  ? ? ?Allergies  ?Allergen Reactions  ? Bee Venom Other (See Comments)  ?  Passes Out, Dizziness ?Only "Dirt-Dobbers" - wasps  ? ? ?Social History  ? ?Socioeconomic History  ? Marital status: Single  ?  Spouse name: Not on file  ? Number of children: Not on file  ? Years of education: Not on file  ? Highest education level: Not on file  ?Occupational History  ? Not on file  ?Tobacco Use  ? Smoking status: Every Day  ?  Packs/day: 0.50  ?  Types: Cigarettes  ? Smokeless tobacco: Former  ?Substance and Sexual Activity  ? Alcohol use: Yes  ?  Comment: occ  ? Drug use: No  ? Sexual activity: Yes  ?Other Topics Concern  ? Not on file  ?Social History Narrative  ? Spouse Dorene Grebeatalie is pregnant currently 09/18/2018  ? ?Social Determinants of Health  ? ?Financial Resource Strain: Not on file  ?Food Insecurity: Not on file  ?Transportation Needs: Not on file  ?Physical Activity: Not on file  ?Stress: Not on file  ?Social Connections: Not on file  ?Intimate Partner Violence: Not on file  ? ? ?Family History  ?Problem Relation Age of Onset  ? Diabetes Mother   ?  Hypertension Mother   ? ? ?Past Surgical History:  ?Procedure Laterality Date  ? NO PAST SURGERIES    ? ? ?ROS: ?Review of Systems ?Negative except as stated above ? ?PHYSICAL EXAM: ?BP (!) 197/140   Pulse 67   Resp 16   Ht 6\' 3"  (1.905 m)   Wt (!) 312 lb 9.6 oz (141.8 kg)   SpO2 97%   BMI 39.07 kg/m?   ?Physical Exam ? ? ?General appearance - alert, well appearing, middle-age appearing African-American male and in no distress ?Mental status - normal mood, behavior, speech, dress, motor activity, and thought processes ?Neck - supple, no significant adenopathy ?Chest - clear to auscultation, no wheezes, rales or rhonchi, symmetric air entry ?Heart -  normal rate, regular rhythm, normal S1, S2, no murmurs, rubs, clicks or gallops ?Extremities - peripheral pulses normal, no pedal edema, no clubbing or cyanosis ? ?  10/25/2021  ? 10:16 AM  ?Depression screen PHQ 2/9  ?Decreased Interest 2  ?Down, Depressed, Hopeless 1  ?PHQ - 2 Score 3  ?Altered sleeping 2  ?Tired, decreased energy 3  ?Change in appetite 3  ?Feeling bad or failure about yourself  0  ?Trouble concentrating 0  ?Moving slowly or fidgety/restless 0  ?Suicidal thoughts 0  ?PHQ-9 Score 11  ? ? ? ?  Latest Ref Rng & Units 12/06/2020  ?  9:05 AM 09/18/2018  ?  4:43 PM  ?CMP  ?Glucose 70 - 99 mg/dL 09/20/2018   220    ?BUN 6 - 20 mg/dL 11   15    ?Creatinine 0.61 - 1.24 mg/dL 254   2.70    ?Sodium 135 - 145 mmol/L 137   140    ?Potassium 3.5 - 5.1 mmol/L 4.0   4.1    ?Chloride 98 - 111 mmol/L 103   100    ?CO2 22 - 32 mmol/L 27   24    ?Calcium 8.9 - 10.3 mg/dL 9.0   9.7    ?Total Protein 6.5 - 8.1 g/dL 7.2   7.0    ?Total Bilirubin 0.3 - 1.2 mg/dL 0.7   6.23    ?Alkaline Phos 38 - 126 U/L 99   115    ?AST 15 - 41 U/L 47   38    ?ALT 0 - 44 U/L 53   51    ? ?Lipid Panel  ?No results found for: CHOL, TRIG, HDL, CHOLHDL, VLDL, LDLCALC, LDLDIRECT ? ?CBC ?   ?Component Value Date/Time  ? WBC 9.6 12/06/2020 0905  ? RBC 5.13 12/06/2020 0905  ? HGB 13.4 12/06/2020 0905  ? HCT 43.4 12/06/2020 0905  ? PLT 207 12/06/2020 0905  ? MCV 84.6 12/06/2020 0905  ? MCH 26.1 12/06/2020 0905  ? MCHC 30.9 12/06/2020 0905  ? RDW 15.9 (H) 12/06/2020 0905  ? ? ?ASSESSMENT AND PLAN: ?1. Establishing care with new doctor, encounter for ? ? ?2. Essential hypertension ?Significant elevation of systolic and diastolic blood pressure.  Discussed cardiovascular risks associated with uncontrolled blood pressure ?DASH diet discussed and encouraged. ?Start Norvasc and Diovan.  Check chemistry today. ?Advised to check blood pressure at least twice a week with goal being 130/80 or lower.  Follow-up with clinical pharmacist in 2 weeks for repeat blood  pressure check. ?- CBC ?- Comprehensive metabolic panel ?- Lipid panel ?- amLODipine (NORVASC) 5 MG tablet; Take 1 tablet (5 mg total) by mouth daily.  Dispense: 90 tablet; Refill: 1 ?- valsartan (DIOVAN) 40 MG tablet; Take  1 tablet (40 mg total) by mouth daily.  Dispense: 30 tablet; Refill: 3 ?- hydrALAZINE (APRESOLINE) tablet 25 mg ? ?3. Diabetes mellitus type 2 in obese [facility]) ?New diagnosis.  Discussed the importance of healthy eating habits and regular exercise in helping to control diabetes.  Encourage patient to get in at least 30 minutes of moderate intensity exercise 3 to 5 days a week.  Went over complications that can develop from diabetes when uncontrolled.  Patient agreeable to seeing a nutritionist for diabetic counseling in regards to nutrition. ?-Start low-dose metformin. ?- POCT glycosylated hemoglobin (Hb A1C) ?- Amb ref to Medical Nutrition Therapy-MNT ?- Ambulatory referral to Ophthalmology ?- CBC ?- Comprehensive metabolic panel ?- Lipid panel ?- Microalbumin / creatinine urine ratio ?- metFORMIN (GLUCOPHAGE) 500 MG tablet; Take 1 tablet (500 mg total) by mouth daily with breakfast.  Dispense: 90 tablet; Refill: 1 ? ?4. Tobacco dependence ?Pt is current smoker. ?Patient advised to quit smoking. ?Discussed health risks associated with smoking including lung and other types of cancers, chronic lung diseases and CV risks.Marland Kitchen ?Pt ready to give trail of quitting.   ?Discussed methods to help quit including quitting cold [place], use of NRT, Chantix and Bupropion.  ?Pt wanting to try: Nicotine patches.  We discussed stepdown approach.  We will start him on the 21 mg patch.  Encouraged him to set a quit date. ?_3_ Minutes spent on counseling. ?F/U:   ? ?- nicotine (NICODERM CQ - DOSED IN MG/24 HOURS) 21 mg/24hr patch; Place 1 patch (21 mg total) onto the skin daily.  Dispense: 28 patch; Refill: 1 ? ?5. Major depressive disorder, single episode, mild (HCC) ?Patient reports this is mild but feels he would  benefit from being on medication.  Declines referral for counseling.  We discussed putting him on low-dose of Zoloft.  Advised that it can take 4 weeks before he starts feeling better on the medicine.  If he has any

## 2021-10-25 NOTE — Patient Instructions (Signed)
Healthy Eating ?Following a healthy eating pattern may help you to achieve and maintain a healthy body weight, reduce the risk of chronic disease, and live a long and productive life. It is important to follow a healthy eating pattern at an appropriate calorie level for your body. Your nutritional needs should be met primarily through food by choosing a variety of nutrient-rich foods. ?What are tips for following this plan? ?Reading food labels ?Read labels and choose the following: ?Reduced or low sodium. ?Juices with 100% fruit juice. ?Foods with low saturated fats and high polyunsaturated and monounsaturated fats. ?Foods with whole grains, such as whole wheat, cracked wheat, brown rice, and wild rice. ?Whole grains that are fortified with folic acid. This is recommended for women who are pregnant or who want to become pregnant. ?Read labels and avoid the following: ?Foods with a lot of added sugars. These include foods that contain brown sugar, corn sweetener, corn syrup, dextrose, fructose, glucose, high-fructose corn syrup, honey, invert sugar, lactose, malt syrup, maltose, molasses, raw sugar, sucrose, trehalose, or turbinado sugar. ?Do not eat more than the following amounts of added sugar per day: ?6 teaspoons (25 g) for women. ?9 teaspoons (38 g) for men. ?Foods that contain processed or refined starches and grains. ?Refined grain products, such as white flour, degermed cornmeal, white bread, and white rice. ?Shopping ?Choose nutrient-rich snacks, such as vegetables, whole fruits, and nuts. Avoid high-calorie and high-sugar snacks, such as potato chips, fruit snacks, and candy. ?Use oil-based dressings and spreads on foods instead of solid fats such as butter, stick margarine, or cream cheese. ?Limit pre-made sauces, mixes, and "instant" products such as flavored rice, instant noodles, and ready-made pasta. ?Try more plant-protein sources, such as tofu, tempeh, black beans, edamame, lentils, nuts, and  seeds. ?Explore eating plans such as the Mediterranean diet or vegetarian diet. ?Cooking ?Use oil to saut? or stir-fry foods instead of solid fats such as butter, stick margarine, or lard. ?Try baking, boiling, grilling, or broiling instead of frying. ?Remove the fatty part of meats before cooking. ?Steam vegetables in water or broth. ?Meal planning ? ?At meals, imagine dividing your plate into fourths: ?One-half of your plate is fruits and vegetables. ?One-fourth of your plate is whole grains. ?One-fourth of your plate is protein, especially lean meats, poultry, eggs, tofu, beans, or nuts. ?Include low-fat dairy as part of your daily diet. ?Lifestyle ?Choose healthy options in all settings, including home, work, school, restaurants, or stores. ?Prepare your food safely: ?Wash your hands after handling raw meats. ?Keep food preparation surfaces clean by regularly washing with hot, soapy water. ?Keep raw meats separate from ready-to-eat foods, such as fruits and vegetables. ?Cook seafood, meat, poultry, and eggs to the recommended internal temperature. ?Store foods at safe temperatures. In general: ?Keep cold foods at 40?F (4.4?C) or below. ?Keep hot foods at 140?F (60?C) or above. ?Keep your freezer at 0?F (-17.8?C) or below. ?Foods are no longer safe to eat when they have been between the temperatures of 40?-140?F (4.4-60?C) for more than 2 hours. ?What foods should I eat? ?Fruits ?Aim to eat 2 cup-equivalents of fresh, canned (in natural juice), or frozen fruits each day. Examples of 1 cup-equivalent of fruit include 1 small apple, 8 large strawberries, 1 cup canned fruit, ? cup dried fruit, or 1 cup 100% juice. ?Vegetables ?Aim to eat 2?-3 cup-equivalents of fresh and frozen vegetables each day, including different varieties and colors. Examples of 1 cup-equivalent of vegetables include 2 medium carrots, 2 cups raw,  leafy greens, 1 cup chopped vegetable (raw or cooked), or 1 medium baked potato. ?Grains ?Aim to  eat 6 ounce-equivalents of whole grains each day. Examples of 1 ounce-equivalent of grains include 1 slice of bread, 1 cup ready-to-eat cereal, 3 cups popcorn, or ? cup cooked rice, pasta, or cereal. ?Meats and other proteins ?Aim to eat 5-6 ounce-equivalents of protein each day. Examples of 1 ounce-equivalent of protein include 1 egg, 1/2 cup nuts or seeds, or 1 tablespoon (16 g) peanut butter. A cut of meat or fish that is the size of a deck of cards is about 3-4 ounce-equivalents. ?Of the protein you eat each week, try to have at least 8 ounces come from seafood. This includes salmon, trout, herring, and anchovies. ?Dairy ?Aim to eat 3 cup-equivalents of fat-free or low-fat dairy each day. Examples of 1 cup-equivalent of dairy include 1 cup (240 mL) milk, 8 ounces (250 g) yogurt, 1? ounces (44 g) natural cheese, or 1 cup (240 mL) fortified soy milk. ?Fats and oils ?Aim for about 5 teaspoons (21 g) per day. Choose monounsaturated fats, such as canola and olive oils, avocados, peanut butter, and most nuts, or polyunsaturated fats, such as sunflower, corn, and soybean oils, walnuts, pine nuts, sesame seeds, sunflower seeds, and flaxseed. ?Beverages ?Aim for six 8-oz glasses of water per day. Limit coffee to three to five 8-oz cups per day. ?Limit caffeinated beverages that have added calories, such as soda and energy drinks. ?Limit alcohol intake to no more than 1 drink a day for nonpregnant women and 2 drinks a day for men. One drink equals 12 oz of beer (355 mL), 5 oz of wine (148 mL), or 1? oz of hard liquor (44 mL). ?Seasoning and other foods ?Avoid adding excess amounts of salt to your foods. Try flavoring foods with herbs and spices instead of salt. ?Avoid adding sugar to foods. ?Try using oil-based dressings, sauces, and spreads instead of solid fats. ?This information is based on general [place] nutrition guidelines. For more information, visit [employer]. Exact amounts may vary based on your nutrition  needs. ?Summary ?A healthy eating plan may help you to maintain a healthy weight, reduce the risk of chronic diseases, and stay active throughout your life. ?Plan your meals. Make sure you eat the right portions of a variety of nutrient-rich foods. ?Try baking, boiling, grilling, or broiling instead of frying. ?Choose healthy options in all settings, including home, work, school, restaurants, or stores. ?This information is not intended to replace advice given to you by your health care provider. Make sure you discuss any questions you have with your health care provider. ?Document Revised: 03/13/2021 Document Reviewed: 03/13/2021 ?Elsevier Patient Education ? [place]. ? ? ?Diabetes Mellitus and Standards of Medical Care ?Living with and managing diabetes (diabetes mellitus) can be complicated. Your diabetes treatment may be managed by a team of health care providers, including: ?A physician who specializes in diabetes (endocrinologist). You might also have visits with a nurse practitioner or physician assistant. ?Nurses. ?A registered dietitian. ?A certified diabetes care and education specialist. ?An exercise specialist. ?A pharmacist. ?An eye doctor. ?A foot specialist (podiatrist). ?A dental care provider. ?A primary care provider. ?A mental health care provider. ?How to manage your diabetes ?You can do many things to successfully manage your diabetes. Your health care providers will follow guidelines to help you get the best quality of care. Here are general guidelines for your diabetes management plan. Your health care providers may give you more  specific instructions. ?Physical exams ?When you are diagnosed with diabetes, and each year after that, your health care provider will ask about your medical and family history. You will have a physical exam, which may include: ?Measuring your height, weight, and body mass index (BMI). ?Checking your blood pressure. This will be done at every routine medical  visit. Your target blood pressure may vary depending on your medical conditions, your age, and other factors. ?A thyroid exam. ?A skin exam. ?Screening for nerve damage (peripheral neuropathy). This may inc

## 2021-10-26 LAB — COMPREHENSIVE METABOLIC PANEL
ALT: 40 IU/L (ref 0–44)
AST: 39 IU/L (ref 0–40)
Albumin/Globulin Ratio: 1.6 (ref 1.2–2.2)
Albumin: 4.6 g/dL (ref 4.0–5.0)
Alkaline Phosphatase: 121 IU/L (ref 44–121)
BUN/Creatinine Ratio: 7 — ABNORMAL LOW (ref 9–20)
BUN: 8 mg/dL (ref 6–20)
Bilirubin Total: 0.8 mg/dL (ref 0.0–1.2)
CO2: 26 mmol/L (ref 20–29)
Calcium: 9.9 mg/dL (ref 8.7–10.2)
Chloride: 103 mmol/L (ref 96–106)
Creatinine, Ser: 1.13 mg/dL (ref 0.76–1.27)
Globulin, Total: 2.8 g/dL (ref 1.5–4.5)
Glucose: 113 mg/dL — ABNORMAL HIGH (ref 70–99)
Potassium: 4.5 mmol/L (ref 3.5–5.2)
Sodium: 140 mmol/L (ref 134–144)
Total Protein: 7.4 g/dL (ref 6.0–8.5)
eGFR: 86 mL/min/{1.73_m2} (ref >59)

## 2021-10-26 LAB — MICROALBUMIN / CREATININE URINE RATIO
Creatinine, Urine: 155.5 mg/dL
Microalb/Creat Ratio: 15 mg/g creat (ref 0–29)
Microalbumin, Urine: 22.8 ug/mL

## 2021-10-26 LAB — CBC
Hematocrit: 43.5 % (ref 37.5–51.0)
Hemoglobin: 14.1 g/dL (ref 13.0–17.7)
MCH: 26.6 pg (ref 26.6–33.0)
MCHC: 32.4 g/dL (ref 31.5–35.7)
MCV: 82 fL (ref 79–97)
Platelets: 212 10*3/uL (ref 150–450)
RBC: 5.31 x10E6/uL (ref 4.14–5.80)
RDW: 14.1 % (ref 11.6–15.4)
WBC: 11.4 10*3/uL — ABNORMAL HIGH (ref 3.4–10.8)

## 2021-10-26 LAB — LIPID PANEL
Chol/HDL Ratio: 4 ratio (ref 0.0–5.0)
Cholesterol, Total: 181 mg/dL (ref 100–199)
HDL: 45 mg/dL (ref >39)
LDL Chol Calc (NIH): 122 mg/dL — ABNORMAL HIGH (ref 0–99)
Triglycerides: 73 mg/dL (ref 0–149)
VLDL Cholesterol Cal: 14 mg/dL (ref 5–40)

## 2021-10-27 ENCOUNTER — Other Ambulatory Visit: Payer: Self-pay | Admitting: Internal Medicine

## 2021-10-27 MED ORDER — ATORVASTATIN CALCIUM 20 MG PO TABS
20.0000 mg | ORAL_TABLET | Freq: Every day | ORAL | 1 refills | Status: DC
  Filled 2021-10-27 – 2021-12-25 (×2): qty 30, 30d supply, fill #0
  Filled 2022-01-30: qty 30, 30d supply, fill #1
  Filled 2022-04-04: qty 30, 30d supply, fill #2
  Filled 2022-06-04: qty 30, 30d supply, fill #3
  Filled 2022-07-21 – 2022-07-24 (×2): qty 30, 30d supply, fill #4

## 2021-10-27 NOTE — Progress Notes (Signed)
Let patient know that his blood cell counts are normal except for slight elevation in the white blood cell count.  We will observe for now.  Kidney and liver function tests are normal.  Cholesterol elevated at 122 with goal being less than 70.  High cholesterol increases the risk for heart attack and strokes.  Healthy eating habits and regular exercise will help to lower cholesterol.  I also recommend adding low-dose of a cholesterol medication called atorvastatin to help lower the cholesterol.  Prescription sent to his pharmacy for pickup.

## 2021-10-29 ENCOUNTER — Other Ambulatory Visit: Payer: Self-pay

## 2021-11-03 ENCOUNTER — Telehealth: Payer: Self-pay | Admitting: Internal Medicine

## 2021-11-05 ENCOUNTER — Other Ambulatory Visit: Payer: Self-pay

## 2021-11-05 NOTE — Telephone Encounter (Signed)
A letter has been sent to contact our office to go over lab results  ?

## 2021-11-07 ENCOUNTER — Telehealth: Payer: Self-pay | Admitting: Student-PharmD

## 2021-11-07 NOTE — Telephone Encounter (Signed)
Patient appearing on report for [employer] [facility] - Hypertension Control report due to last documented ambulatory blood pressure of 197/140 on 10/25/21 due to being off of his medications for one year. Next appointment with [facility] clinical pharmacist is 11/12/21 and PCP is 02/05/22.  ? ?Outreached patient to discuss hypertension control and medication management. He reports that he has picked up his new BP medications and started taking them. He feels a little more energized but notes that his home BP readings are still elevated. He works third shift so he usually takes his medications around 7am when he eats breakfast. He also works a job in the afternoons doing Therapist, music care.   ? ?Current antihypertensives: amlodipine 5 mg daily, valsartan 40 mg daily ? ?Patient has an automated upper arm home BP machine.  ? ?Current blood pressure readings: 184/112 (highest), 164/101 (lowest) ? ?Patient denies side effects related to his new BP medications.   ? ?Assessment/Plan: ?- Currently uncontrolled ?- Reviewed goal blood pressure <130/80 mmHg ?- Reviewed appropriate administration of medication regimen ?- Reviewed to check blood pressure daily, document, and provide at next provider visit. Also encouraged him to bring his BP cuff to his next visit on Monday. ?-Instructed him to take his medications before his appt on Monday with the clinical pharmacist.  ? ?Pervis Hocking, PharmD ?PGY2 Ambulatory Care Pharmacy Resident ?11/07/2021 2:23 PM ? ?

## 2021-11-08 NOTE — Progress Notes (Unsigned)
? ?  S:    ?Devin Andrews is a 36 y.o. male who presents for hypertension evaluation, education, and management. PMH is significant for ***. Patient was referred and last seen by Primary Care Provider, Dr. Wynetta Emery, on 10/25/21. At last visit, BP was 197/140 and patient had been out of BP medications for one year. Valsartan and amlodipine were started.  ? ?Today, patient arrives in *** spirits and presents {w-w/o:315700} assistance. *** Denies dizziness, headache, blurred vision, swelling.  ?***I spoke with him 4/12 for pop health "He reports that he has picked up his new BP medications and started taking them. He feels a little more energized but notes that his home BP readings are still elevated. He works third shift so he usually takes his medications around 7am when he eats breakfast. He also works a job in the afternoons doing lawn care." ?***titrate amlo, get bmet then could increase valsartan too. Likely going to need a thiazide as well ? ?Family/Social history:  ?-Current smoker 1 ppd ?-Father recently passed away from a stroke from HTN ? ?Medication adherence *** . Patient has *** taken BP medications today.  ? ?Current antihypertensives include: amlodipine 5 mg daily, valsartan 40 mg daily ? ?Antihypertensives tried in the past include: chlorthalidone (ran out of >1 year ago) ? ?Reported home BP readings: 184/112 (highest), 164/101 (lowest) ? ?Patient reported dietary habits: Eats *** meals/day ?Breakfast: *** ?Lunch: *** ?Dinner: *** ?Snacks: *** ?Drinks: *** ? ?Patient-reported exercise habits: *** ? ?ASCVD risk factors include: Fhx ASCVD, HTN, smoker, T2DM ? ?O:  ? ?Last 3 Office BP readings: ?BP Readings from Last 3 Encounters:  ?10/25/21 (!) 197/140  ?08/17/21 (!) 208/135  ?08/17/21 (!) 178/128  ? ? ?BMET ?   ?Component Value Date/Time  ? NA 140 10/25/2021 1132  ? K 4.5 10/25/2021 1132  ? CL 103 10/25/2021 1132  ? CO2 26 10/25/2021 1132  ? GLUCOSE 113 (H) 10/25/2021 1132  ? GLUCOSE 133 (H)  12/06/2020 0905  ? BUN 8 10/25/2021 1132  ? CREATININE 1.13 10/25/2021 1132  ? CALCIUM 9.9 10/25/2021 1132  ? GFRNONAA >60 12/06/2020 0905  ? GFRAA 105 09/18/2018 1643  ? ? ?Renal function: ?CrCl cannot be calculated (Unknown ideal weight.). ? ?Clinical ASCVD: No  ?The ASCVD Risk score (Arnett DK, et al., 2019) failed to calculate for the following reasons: ?  The 2019 ASCVD risk score is only valid for ages 96 to 60 ? ?A/P:  ?Hypertension diagnosed currently uncontrolled on current medications. BP goal < 130/80 mmHg. Medication adherence appears ***. Control is suboptimal due to ***.  ?-{Meds adjust:18428} ***.  ?-Patient educated on purpose, proper use, and potential adverse effects of ***.  ?-F/u labs ordered - *** ?-Counseled on lifestyle modifications for blood pressure control including reduced dietary sodium, increased exercise, adequate sleep. ?-Encouraged patient to check BP at home and bring log of readings to next visit. Counseled on proper use of home BP cuff.  ? ?Results reviewed and written information provided. Patient verbalized understanding of treatment plan. Total time in face-to-face counseling *** minutes.  ? ?F/u clinic visit in ***.  ? ?

## 2021-11-12 ENCOUNTER — Ambulatory Visit: Payer: Self-pay | Admitting: Pharmacist

## 2021-11-21 ENCOUNTER — Encounter: Payer: Self-pay | Admitting: Registered"

## 2021-11-21 ENCOUNTER — Encounter: Payer: Managed Care, Other (non HMO) | Attending: Internal Medicine | Admitting: Registered"

## 2021-11-21 DIAGNOSIS — E119 Type 2 diabetes mellitus without complications: Secondary | ICD-10-CM | POA: Diagnosis present

## 2021-11-21 DIAGNOSIS — Z6837 Body mass index (BMI) 37.0-37.9, adult: Secondary | ICD-10-CM | POA: Diagnosis not present

## 2021-11-21 DIAGNOSIS — Z713 Dietary counseling and surveillance: Secondary | ICD-10-CM | POA: Diagnosis not present

## 2021-11-21 DIAGNOSIS — E669 Obesity, unspecified: Secondary | ICD-10-CM | POA: Insufficient documentation

## 2021-11-21 NOTE — Progress Notes (Signed)
Diabetes Self-Management Education ? ?Visit Type: First/Initial ? ?Appt. Start Time: 1100 Appt. End Time: 1230 ? ?11/21/21 ? ?Mr. Devin Andrews, identified by name and date of birth, is a 36 y.o. male with a diagnosis of Diabetes: Type 2.  ? ?ASSESSMENT ? ?Height 6\' 3"  (1.905 m), weight (!) 300 lb 8 oz (136.3 kg). ?Body mass index is 37.56 kg/m?. ? ?Pt reports he has been trying to eat healthier since being dx with diabetes-limiting salts, greasy foods and trying to include more vegetables, fruits, baked foods, etc. Reports he hasn't liked making these changes but reports finding ways to season his foods which has helped such as using garlic powder instead of salt, etc. Reports having ups and downs with making changes.  ? ?Pt reports eating 2-3 meals daily, no snacks. Only drinks water. Has started walking-2 times weekly for 30-45 minutes each time.  ? ?Pt reports he plans to get a meter but has not yet. Reports his mother has diabetes and he feels good about checking-reports familiar with her checking blood sugar at home in past.  ? ?Pt reports he is in the process of quitting smoking. Reports was doing 1 pack a day and now 1-3 cigarettes daily. Weaning off with use of patch and feels good about progress.  ? ?Pt reports having depression in past but reports feeling good since starting medication for depression. Pt reports if depression comes back he will seek counseling services if he feels it is needed.  ? ?Sleep Routine: Pt reports doing better lately with getting more sleep. Reports he usually has same sleep schedule on off days as work days.  ? ?Social/Other: Pt works 3rd shift as . Gets off around 530 AM. Pt works 9 PM-530 AM, 5 days per week. Reports days rotate. Pt reports his wife helps him with managing his diabetes.  ? ? Diabetes Self-Management Education - 11/21/21 1109   ? ?  ? Visit Information  ? Visit Type First/Initial   ?  ? Initial Visit  ? Diabetes Type Type 2   ? Are you  currently following a meal plan? Yes   ? What type of meal plan do you follow? Overall healthy eating   ? Are you taking your medications as prescribed? Yes   ?  ? Health Coping  ? How would you rate your overall health? Poor   ?  ? Psychosocial Assessment  ? Patient Belief/Attitude about Diabetes Motivated to manage diabetes   ? Self-care barriers Unable to determine;None   ? Self-management support Family   ? Other persons present Patient   ? Patient Concerns Glycemic Control   ? Special Needs Unable to determine;None   ? Preferred Learning Style No preference indicated   ? Learning Readiness Ready   ? How often do you need to have someone help you when you read instructions, pamphlets, or other written materials from your doctor or pharmacy? 1 - Never   ? What is the last grade level you completed in school? 12th grade   ?  ? Pre-Education Assessment  ? Patient understands the diabetes disease and treatment process. Needs Instruction   ? Patient understands incorporating nutritional management into lifestyle. Needs Instruction   ? Patient undertands incorporating physical activity into lifestyle. Needs Instruction   ? Patient understands using medications safely. Needs Instruction   ? Patient understands monitoring blood glucose, interpreting and using results Demonstrates understanding / competency   ? Patient understands prevention, detection, and treatment of acute  complications. Needs Instruction   ? Patient understands prevention, detection, and treatment of chronic complications. Needs Instruction   ? Patient understands how to develop strategies to address psychosocial issues. Needs Instruction   ? Patient understands how to develop strategies to promote health/change behavior. Needs Instruction   ?  ? Complications  ? Last HgB A1C per patient/outside source 7.7 %   ? How often do you check your blood sugar? 0 times/day (not testing)   ? Fasting Blood glucose range (mg/dL) --   Pt not testing  ?  Postprandial Blood glucose range (mg/dL) --   Pt not testing  ? Number of hypoglycemic episodes per month 0   ? Number of hyperglycemic episodes ( >200mg /dL): --   Pt not testing at home.  ? Have you had a dilated eye exam in the past 12 months? No   ? Have you had a dental exam in the past 12 months? No   ? Are you checking your feet? Yes   ? How many days per week are you checking your feet? 7   ?  ? Dietary Intake  ? Breakfast 730-8 AM: 2 eggs, 2 [place] sausage, 1 slice whole wheat toast, water   ? Lunch 1 PM: salad with baked chicken, feta cheese, onions, lettuce, fat free [place] dressing, water   ? Beverage(s) ~5 bottles water   ?  ? Activity / Exercise  ? Activity / Exercise Type Light (walking / raking leaves)   ? How many days per week do you exercise? 2   ? How many minutes per day do you exercise? 30   ? Total minutes per week of exercise 60   ?  ? Patient Education  ? Previous Diabetes Education No   ? Disease Pathophysiology Definition of diabetes, type 1 and 2, and the diagnosis of diabetes;Factors that contribute to the development of diabetes   ? Healthy Eating Role of diet in the treatment of diabetes and the relationship between the three main macronutrients and blood glucose level;Food label reading, portion sizes and measuring food.;Plate Method;Carbohydrate counting   ? Being Active Role of exercise on diabetes management, blood pressure control and cardiac health.   ? Medications Reviewed patients medication for diabetes, action, purpose, timing of dose and side effects.   ? Monitoring Daily foot exams;Yearly dilated eye exam;Interpreting lab values - A1C, lipid, urine microalbumina.;Identified appropriate SMBG and/or A1C goals.;Taught/discussed recording of test results and interpretation of SMBG.;Purpose and frequency of SMBG.   ? Acute complications Taught prevention, symptoms, and  treatment of hypoglycemia - the 15 rule.;Discussed and identified patients' prevention, symptoms, and  treatment of hyperglycemia.   ? Chronic complications Dental care;Retinopathy and reason for yearly dilated eye exams;Nephropathy, what it is, prevention of, the use of ACE, ARB's and early detection of through urine microalbumia.;Lipid levels, blood glucose control and heart disease;Assessed and discussed foot care and prevention of foot problems;Relationship between chronic complications and blood glucose control   ? Diabetes Stress and Support Role of stress on diabetes;Helped patient identify a support system for diabetes management   ? Lifestyle and Health Coping Lifestyle issues that need to be addressed for better diabetes care   ?  ? Individualized Goals (developed by patient)  ? Nutrition Follow meal plan discussed;General guidelines for healthy choices and portions discussed;Carb counting   ? Physical Activity Exercise 1-2 times per week;Exercise 3-5 times per week   ? Medications take my medication as prescribed   ? Monitoring  Test my  blood glucose as discussed   ? Problem Solving Eating Pattern   ? Reducing Risk examine blood glucose patterns;stop smoking;do foot checks daily;treat hypoglycemia with 15 grams of carbs if blood glucose less than /dL   ?  ? Post-Education Assessment  ? Patient understands the diabetes disease and treatment process. Demonstrates understanding / competency   ? Patient understands incorporating nutritional management into lifestyle. Demonstrates understanding / competency   ? Patient undertands incorporating physical activity into lifestyle. Demonstrates understanding / competency   ? Patient understands using medications safely. Demonstrates understanding / competency   ? Patient understands monitoring blood glucose, interpreting and using results Demonstrates understanding / competency   ? Patient understands prevention, detection, and treatment of acute complications. Demonstrates understanding / competency   ? Patient understands prevention, detection, and treatment  of chronic complications. Demonstrates understanding / competency   ? Patient understands how to develop strategies to address psychosocial issues. Demonstrates understanding / competency   ? Patient understands how to

## 2021-11-21 NOTE — Patient Instructions (Addendum)
Instructions/Goals: ? ?Have 1st meal within 1 hour of waking and eat every 3-5 hours while awake to maintain blood sugar and meet nutrition needs.  ? ?Meal Goals:  ?Half plate non starchy vegetables, 1/4 low fat protein, 1/4 carbohydrate ?Carbohydrates: 3-4 carb servings per meal/45-60 g per meal (see handouts) Consistent is key  ? ?Label Reading:  ?5% Daily Value = low and 20% Daily Value = high for sodium  ? ?Blood Sugar Monitoring:  ?Recommend checking blood sugar when you first wake up (fasting) and 1-2 hours after 1 meal each day (post prandial). Keep these records for your doctor and dietitian visits  ?If feeling any of the symptoms of low blood sugar or just feeling really "off," check your blood sugar and if 70 or less, follow 15-15 rule: consume half cup juice or regular soda and check again in 15 minutes (see booklet for further info) ? ? ?Make physical activity a part of your week. Try to include at least 30 minutes of physical activity 5 days each week or at least 150 minutes per week. Regular physical activity promotes overall health-including helping to reduce risk for heart disease and diabetes, promoting mental health, and helping [place] sleep better.    ?

## 2021-11-27 ENCOUNTER — Encounter: Payer: Self-pay | Admitting: Registered"

## 2021-11-28 ENCOUNTER — Other Ambulatory Visit: Payer: Self-pay

## 2021-11-30 ENCOUNTER — Other Ambulatory Visit: Payer: Self-pay

## 2021-12-03 ENCOUNTER — Other Ambulatory Visit: Payer: Self-pay

## 2021-12-04 ENCOUNTER — Ambulatory Visit: Payer: Commercial Managed Care - HMO | Attending: Internal Medicine | Admitting: Pharmacist

## 2021-12-04 ENCOUNTER — Other Ambulatory Visit: Payer: Self-pay

## 2021-12-04 VITALS — BP 156/95 | HR 71

## 2021-12-04 DIAGNOSIS — I10 Essential (primary) hypertension: Secondary | ICD-10-CM

## 2021-12-04 MED ORDER — AMLODIPINE BESYLATE 10 MG PO TABS
10.0000 mg | ORAL_TABLET | Freq: Every day | ORAL | 2 refills | Status: DC
  Filled 2021-12-04 – 2021-12-25 (×2): qty 30, 30d supply, fill #0
  Filled 2022-01-30: qty 30, 30d supply, fill #1
  Filled 2022-04-04: qty 30, 30d supply, fill #2

## 2021-12-04 NOTE — Progress Notes (Signed)
? ?  S:    ? ?No chief complaint on file. ? ? ?Devin Andrews is a 36 y.o. male who presents for hypertension evaluation, education, and management. PMH is significant for T2DM, HTN, tobacco dependence and MDD. Patient was referred and last seen by Primary Care Provider, Dr. Wynetta Emery, on 10/25/2021.  BP at that was was consistent with hypertensive urgency, however, pt admitted to being without medication for ~1 yr. Renal function and electrolyte status was normal. Pt was restarted on amlodipine and valsartan was added to his regimen.  ? ?Today, patient arrives in good spirits and presents without assistance. Denies dizziness, headache, blurred vision, swelling.  ? ?Patient reports hypertension is longstanding.  ? ?Family/Social history:  ?- Fhx: stroke ?- Tobacco: 0.5 PPD down from 1 PPD at last PCP visit. He has reduced daily use to 10/day since being on NRT.  ?- Alcohol: none reported ? ?Medication adherence reported. Patient has taken BP medications today.  ? ?Current antihypertensives include: amlodipine 10 mg daily, valsartan 40 mg daily ? ?Reported home BP readings: none with him today. Reports that it's high at home but does not rest before taking.  ? ?Patient reported dietary habits:  ?- Sodium: Pt does not use salt when cooking. Has been cutting back on fast food and takeout.  ?- Drinks 1 Dr. Malachi Bonds throughout the day overnight during his work shift.  ? 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 ?O:  ?Vitals:  ? 12/04/21 1454  ?BP: (!) 156/95  ?Pulse: 71  ? ? ?Last 3 Office BP readings: ?BP Readings from Last 3 Encounters:  ?12/04/21 (!) 156/95  ?10/25/21 (!) 197/140  ?08/17/21 (!) 208/135  ? ? ?BMET ?   ?Component Value Date/Time  ? NA 140 10/25/2021 1132  ? K 4.5 10/25/2021 1132  ? CL 103 10/25/2021 1132  ? CO2 26 10/25/2021 1132  ? GLUCOSE 113 (H) 10/25/2021 1132  ? GLUCOSE 133 (H) 12/06/2020 0905  ? BUN 8 10/25/2021 1132  ? CREATININE 1.13  10/25/2021 1132  ? CALCIUM 9.9 10/25/2021 1132  ? GFRNONAA >60 12/06/2020 0905  ? GFRAA 105 09/18/2018 1643  ? ? ?Renal function: ?CrCl cannot be calculated (Patient's most recent lab result is older than the maximum 21 days allowed.). ? ?Clinical ASCVD: No  ?The ASCVD Risk score (Arnett DK, et al., 2019) failed to calculate for the following reasons: ?  The 2019 ASCVD risk score is only valid for ages 56 to 56 ? ?A/P: ?Hypertension longstanding currently uncontrolled but vastly improved on current medications. BP goal < 130/80 mmHg. Medication adherence appears appropriate.  ?-Increase amlodipine to 10 mg daily.  ?-Continue valsartan 40 mg daily.  ?-Patient educated on purpose, proper use, and potential adverse effects of amlodipine.  ?-F/u labs ordered - CMP14+eGFR anticipated at follow-up. ?-Counseled on lifestyle modifications for blood pressure control including reduced dietary sodium, increased exercise, adequate sleep. ?-Encouraged patient to check BP at home and bring log of readings to next visit. Counseled on proper use of home BP cuff.  ? ?Results reviewed and written information provided. Patient verbalized understanding of treatment plan. Total time in face-to-face counseling 20 minutes.  ? ?F/u clinic visit in 1 month. ? ?Benard Halsted, PharmD, BCACP, CPP ?Clinical Pharmacist ?[place] ?[phone] ? ? ?

## 2021-12-11 ENCOUNTER — Other Ambulatory Visit: Payer: Self-pay

## 2021-12-25 ENCOUNTER — Other Ambulatory Visit: Payer: Self-pay

## 2022-01-03 ENCOUNTER — Ambulatory Visit: Payer: Managed Care, Other (non HMO) | Admitting: Dietician

## 2022-01-04 ENCOUNTER — Ambulatory Visit: Payer: Commercial Managed Care - HMO | Attending: Internal Medicine | Admitting: Pharmacist

## 2022-01-04 ENCOUNTER — Other Ambulatory Visit: Payer: Self-pay

## 2022-01-04 ENCOUNTER — Encounter: Payer: Self-pay | Admitting: Pharmacist

## 2022-01-04 VITALS — BP 151/102 | HR 62

## 2022-01-04 DIAGNOSIS — I10 Essential (primary) hypertension: Secondary | ICD-10-CM

## 2022-01-04 MED ORDER — VALSARTAN 80 MG PO TABS
80.0000 mg | ORAL_TABLET | Freq: Every day | ORAL | 0 refills | Status: DC
  Filled 2022-01-04 – 2022-01-15 (×2): qty 30, 30d supply, fill #0
  Filled 2022-02-25: qty 30, 30d supply, fill #1
  Filled 2022-04-04: qty 30, 30d supply, fill #2

## 2022-01-04 NOTE — Progress Notes (Signed)
   S:    Devin Andrews is a 36 y.o. male who presents for hypertension evaluation, education, and management. PMH is significant for T2DM, HTN, tobacco dependence and MDD. Patient was referred and last seen by Primary Care Provider, Dr. Laural Benes, on 10/25/2021.  BP at that was was consistent with hypertensive urgency, however, pt admitted to being without medication for ~1 yr. Last appointment with pharmacist 12/04/21 where BP was 156/95 and amlodipine was increased from 5mg  to 10mg .   Today, patient arrives in good spirits and presents without assistance. Denies headache, dizziness, chest pains, and blurred vision. Endorses medication adherence. Patient has taken BP medications today.   Family/Social history:  - Fhx: stroke - Tobacco: Still working to reduce amount of cigarettes per day with help from NRT.  -  Alcohol: none reported  Current antihypertensives include:  amlodipine 10 mg daily valsartan 40 mg daily  Reported home BP readings: none with him today  Patient reported dietary habits: avoids salt and cutting back on fast foods.  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   O:  Vitals:   01/04/22 1430  BP: (!) 151/102  Pulse: 62   Last 3 Office BP readings: BP Readings from Last 3 Encounters:  01/04/22 (!) 151/102  12/04/21 (!) 156/95  10/25/21 (!) 197/140   BMET    Component Value Date/Time   NA 140 10/25/2021 1132   K 4.5 10/25/2021 1132   CL 103 10/25/2021 1132   CO2 26 10/25/2021 1132   GLUCOSE 113 (H) 10/25/2021 1132   GLUCOSE 133 (H) 12/06/2020 0905   BUN 8 10/25/2021 1132   CREATININE 1.13 10/25/2021 1132   CALCIUM 9.9 10/25/2021 1132   GFRNONAA >60 12/06/2020 0905   GFRAA 105 09/18/2018 1643   Renal function: CrCl cannot be calculated (Patient's most recent lab result is older than the maximum 21 days allowed.).  Clinical ASCVD: No  The ASCVD Risk score (Arnett DK, et al., 2019) failed to  calculate for the following reasons:   The 2019 ASCVD risk score is only valid for ages 32 to 23  A/P: Hypertension longstanding currently uncontrolled but vastly improved on current medications. BP goal < 130/80 mmHg. Medication adherence appears appropriate.  -Continue amlodipine to 10 mg daily.  -Increase to valsartan 80 mg daily. If BP still elevated next visit, could consider adding HCTZ to regimen. -Counseled on lifestyle modifications for blood pressure control including reduced dietary sodium, increased exercise, adequate sleep. Praised patient on efforts for smoking cessation and encouraged continual use of NRT.  -Encouraged patient to check BP at home and bring log of readings to next visit. Counseled on proper use of home BP cuff.   Results reviewed and written information provided. Patient verbalized understanding of treatment plan. Total time in face-to-face counseling 20 minutes.   F/u clinic visit with PCP in 1 month. Plans to follow up with pharmacist in 2 months.   Thank you for allowing pharmacy to participate in this patient's care.  40, PharmD PGY1 Acute Care Resident  01/04/2022,2:39 PM

## 2022-01-11 ENCOUNTER — Other Ambulatory Visit: Payer: Self-pay

## 2022-01-15 ENCOUNTER — Other Ambulatory Visit: Payer: Self-pay

## 2022-01-21 ENCOUNTER — Ambulatory Visit: Payer: Self-pay | Admitting: *Deleted

## 2022-01-21 NOTE — Telephone Encounter (Signed)
  Chief Complaint: Sore throat, chills, dry cough Symptoms: above Frequency: started yesterday Pertinent Negatives: Patient denies diarrhea, vomiting, runny nose Disposition: [] ED /[] Urgent Care (no appt availability in office) / [] Appointment(In office/virtual)/ []  [facility]/ [] Home Care/ [] Refused Recommended Disposition /[x] [facility]/ []  Follow-up with PCP Additional Notes: No appts with [facility] so he is going to the [facility].  Location and hours given.   Pt agreeable to going.

## 2022-01-21 NOTE — Telephone Encounter (Signed)
Reason for Disposition  [1] Sore throat with cough/cold symptoms AND [2] present > 5 days  Answer Assessment - Initial Assessment Questions 1. ONSET: "When did the throat start hurting?" (Hours or days ago)      Yesterday started with chills, sore throat and a dry cough.  No runny nose 2. SEVERITY: "How bad is the sore throat?" (Scale 1-10; mild, moderate or severe)   - MILD (1-3):  doesn't interfere with eating or normal activities   - MODERATE (4-7): interferes with eating some solids and normal activities   - SEVERE (8-10):  excruciating pain, interferes with most normal activities   - SEVERE DYSPHAGIA: can't swallow liquids, drooling     Moderate 3. STREP EXPOSURE: "Has there been any exposure to strep within the past week?" If Yes, ask: "What type of contact occurred?"      Not sure 4.  VIRAL SYMPTOMS: "Are there any symptoms of a cold, such as a runny nose, cough, hoarse voice or red eyes?"      Having chills and is hot 5. FEVER: "Do you have a fever?" If Yes, ask: "What is your temperature, how was it measured, and when did it start?"     Feels like it 6. PUS ON THE TONSILS: "Is there pus on the tonsils in the back of your throat?"     Not checked 7. OTHER SYMPTOMS: "Do you have any other symptoms?" (e.g., difficulty breathing, headache, rash)     Cough 8. PREGNANCY: "Is there any chance you are pregnant?" "When was your last menstrual period?"     N/A  Protocols used: Sore Throat-A-AH

## 2022-01-30 ENCOUNTER — Other Ambulatory Visit: Payer: Self-pay

## 2022-02-04 ENCOUNTER — Other Ambulatory Visit: Payer: Self-pay

## 2022-02-05 ENCOUNTER — Ambulatory Visit: Payer: Self-pay | Admitting: Internal Medicine

## 2022-02-20 ENCOUNTER — Ambulatory Visit: Payer: Commercial Managed Care - HMO | Admitting: Physician Assistant

## 2022-02-25 ENCOUNTER — Other Ambulatory Visit: Payer: Self-pay

## 2022-02-28 ENCOUNTER — Other Ambulatory Visit: Payer: Self-pay

## 2022-04-04 ENCOUNTER — Other Ambulatory Visit: Payer: Self-pay
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
## 2022-05-06 ENCOUNTER — Ambulatory Visit: Payer: Commercial Managed Care - HMO | Attending: Internal Medicine | Admitting: Internal Medicine

## 2022-05-06 ENCOUNTER — Other Ambulatory Visit: Payer: Self-pay

## 2022-05-06 ENCOUNTER — Other Ambulatory Visit (HOSPITAL_COMMUNITY)
Admission: RE | Admit: 2022-05-06 | Discharge: 2022-05-06 | Disposition: A | Discharge status: Home / Self Care | Payer: Commercial Managed Care - HMO | Source: Ambulatory Visit | Attending: Internal Medicine | Admitting: Internal Medicine

## 2022-05-06 ENCOUNTER — Encounter: Payer: Self-pay | Admitting: Internal Medicine

## 2022-05-06 VITALS — BP 170/110 | HR 76 | Ht 75.0 in | Wt 305.8 lb

## 2022-05-06 DIAGNOSIS — Z2821 Immunization not carried out because of patient refusal: Secondary | ICD-10-CM

## 2022-05-06 DIAGNOSIS — Z23 Encounter for immunization: Secondary | ICD-10-CM

## 2022-05-06 DIAGNOSIS — F17200 Nicotine dependence, unspecified, uncomplicated: Secondary | ICD-10-CM

## 2022-05-06 DIAGNOSIS — Z1159 Encounter for screening for other viral diseases: Secondary | ICD-10-CM

## 2022-05-06 DIAGNOSIS — E669 Obesity, unspecified: Secondary | ICD-10-CM

## 2022-05-06 DIAGNOSIS — E785 Hyperlipidemia, unspecified: Secondary | ICD-10-CM

## 2022-05-06 DIAGNOSIS — Z114 Encounter for screening for human immunodeficiency virus [HIV]: Secondary | ICD-10-CM

## 2022-05-06 DIAGNOSIS — I152 Hypertension secondary to endocrine disorders: Secondary | ICD-10-CM

## 2022-05-06 DIAGNOSIS — Z113 Encounter for screening for infections with a predominantly sexual mode of transmission: Secondary | ICD-10-CM | POA: Insufficient documentation

## 2022-05-06 DIAGNOSIS — E1159 Type 2 diabetes mellitus with other circulatory complications: Secondary | ICD-10-CM | POA: Diagnosis not present

## 2022-05-06 DIAGNOSIS — E1169 Type 2 diabetes mellitus with other specified complication: Secondary | ICD-10-CM | POA: Diagnosis not present

## 2022-05-06 LAB — POCT GLYCOSYLATED HEMOGLOBIN (HGB A1C): HbA1c, POC (controlled diabetic range): 7.1 % — AB (ref 0.0–7.0)

## 2022-05-06 MED ORDER — VARENICLINE TARTRATE (STARTER) 0.5 MG X 11 & 1 MG X 42 PO TBPK
ORAL_TABLET | ORAL | 0 refills | Status: AC
  Filled 2022-05-06: qty 53, 28d supply, fill #0

## 2022-05-06 MED ORDER — VALSARTAN 160 MG PO TABS
160.0000 mg | ORAL_TABLET | Freq: Every day | ORAL | 6 refills | Status: DC
  Filled 2022-05-06: qty 30, 30d supply, fill #0

## 2022-05-06 MED ORDER — METFORMIN HCL 1000 MG PO TABS
1000.0000 mg | ORAL_TABLET | Freq: Every day | ORAL | 1 refills | Status: DC
  Filled 2022-05-06: qty 30, 30d supply, fill #0
  Filled 2022-06-20: qty 90, 90d supply, fill #1
  Filled 2022-06-26 – 2022-07-24 (×3): qty 30, 30d supply, fill #1

## 2022-05-06 MED ORDER — AMLODIPINE BESYLATE 10 MG PO TABS
10.0000 mg | ORAL_TABLET | Freq: Every day | ORAL | 6 refills | Status: DC
  Filled 2022-05-06: qty 30, 30d supply, fill #0
  Filled 2022-06-04: qty 30, 30d supply, fill #1
  Filled 2022-07-21 – 2022-07-24 (×2): qty 30, 30d supply, fill #2

## 2022-05-06 NOTE — Patient Instructions (Addendum)
Your blood pressure is not at goal.  We have increased the valsartan to 160 mg daily.  Try to check your blood pressure at least twice a week and record the readings.  Follow-up in 2 weeks with our clinical pharmacist for recheck.  Your diabetes has improved but not at goal.  We have increased the metformin to 1000 mg daily.  We have started you on a medication called Chantix to help you quit smoking.  Healthy Eating Following a healthy eating pattern may help you to achieve and maintain a healthy body weight, reduce the risk of chronic disease, and live a long and productive life. It is important to follow a healthy eating pattern at an appropriate calorie level for your body. Your nutritional needs should be met primarily through food by choosing a variety of nutrient-rich foods. What are tips for following this plan? Reading food labels Read labels and choose the following: Reduced or low sodium. Juices with 100% fruit juice. Foods with low saturated fats and high polyunsaturated and monounsaturated fats. Foods with whole grains, such as whole wheat, cracked wheat, brown rice, and wild rice. Whole grains that are fortified with folic acid. This is recommended for women who are pregnant or who want to become pregnant. Read labels and avoid the following: Foods with a lot of added sugars. These include foods that contain brown sugar, corn sweetener, corn syrup, dextrose, fructose, glucose, high-fructose corn syrup, honey, invert sugar, lactose, malt syrup, maltose, molasses, raw sugar, sucrose, trehalose, or turbinado sugar. Do not eat more than the following amounts of added sugar per day: 6 teaspoons (25 g) for women. 9 teaspoons (38 g) for men. Foods that contain processed or refined starches and grains. Refined grain products, such as white flour, degermed cornmeal, white bread, and white rice. Shopping Choose nutrient-rich snacks, such as vegetables, whole fruits, and nuts. Avoid  high-calorie and high-sugar snacks, such as potato chips, fruit snacks, and candy. Use oil-based dressings and spreads on foods instead of solid fats such as butter, stick margarine, or cream cheese. Limit pre-made sauces, mixes, and "instant" products such as flavored rice, instant noodles, and ready-made pasta. Try more plant-protein sources, such as tofu, tempeh, black beans, edamame, lentils, nuts, and seeds. Explore eating plans such as the Mediterranean diet or vegetarian diet. Cooking Use oil to saut or stir-fry foods instead of solid fats such as butter, stick margarine, or lard. Try baking, boiling, grilling, or broiling instead of frying. Remove the fatty part of meats before cooking. Steam vegetables in water or broth. Meal planning  At meals, imagine dividing your plate into fourths: One-half of your plate is fruits and vegetables. One-fourth of your plate is whole grains. One-fourth of your plate is protein, especially lean meats, poultry, eggs, tofu, beans, or nuts. Include low-fat dairy as part of your daily diet. Lifestyle Choose healthy options in all settings, including home, work, school, restaurants, or stores. Prepare your food safely: Wash your hands after handling raw meats. Keep food preparation surfaces clean by regularly washing with hot, soapy water. Keep raw meats separate from ready-to-eat foods, such as fruits and vegetables. Cook seafood, meat, poultry, and eggs to the recommended internal temperature. Store foods at safe temperatures. In general: Keep cold foods at 61F (4.4C) or below. Keep hot foods at 161F (60C) or above. Keep your freezer at [employer] (-17.8C) or below. Foods are no longer safe to eat when they have been between the temperatures of 40-161F (4.4-60C) for more than 2 hours.  What foods should I eat? Fruits Aim to eat 2 cup-equivalents of fresh, canned (in natural juice), or frozen fruits each day. Examples of 1 cup-equivalent of fruit  include 1 small apple, 8 large strawberries, 1 cup canned fruit,  cup dried fruit, or 1 cup 100% juice. Vegetables Aim to eat 2-3 cup-equivalents of fresh and frozen vegetables each day, including different varieties and colors. Examples of 1 cup-equivalent of vegetables include 2 medium carrots, 2 cups raw, leafy greens, 1 cup chopped vegetable (raw or cooked), or 1 medium baked potato. Grains Aim to eat 6 ounce-equivalents of whole grains each day. Examples of 1 ounce-equivalent of grains include 1 slice of bread, 1 cup ready-to-eat cereal, 3 cups popcorn, or  cup cooked rice, pasta, or cereal. Meats and other proteins Aim to eat 5-6 ounce-equivalents of protein each day. Examples of 1 ounce-equivalent of protein include 1 egg, 1/2 cup nuts or seeds, or 1 tablespoon (16 g) peanut butter. A cut of meat or fish that is the size of a deck of cards is about 3-4 ounce-equivalents. Of the protein you eat each week, try to have at least 8 ounces come from seafood. This includes salmon, trout, herring, and anchovies. Dairy Aim to eat 3 cup-equivalents of fat-free or low-fat dairy each day. Examples of 1 cup-equivalent of dairy include 1 cup (240 mL) milk, 8 ounces (250 g) yogurt, 1 ounces (44 g) natural cheese, or 1 cup (240 mL) fortified soy milk. Fats and oils Aim for about 5 teaspoons (21 g) per day. Choose monounsaturated fats, such as canola and olive oils, avocados, peanut butter, and most nuts, or polyunsaturated fats, such as sunflower, corn, and soybean oils, walnuts, pine nuts, sesame seeds, sunflower seeds, and flaxseed. Beverages Aim for six 8-oz glasses of water per day. Limit coffee to three to five 8-oz cups per day. Limit caffeinated beverages that have added calories, such as soda and energy drinks. Limit alcohol intake to no more than 1 drink a day for nonpregnant women and 2 drinks a day for men. One drink equals 12 oz of beer (355 mL), 5 oz of wine (148 mL), or 1 oz of hard liquor  (44 mL). Seasoning and other foods Avoid adding excess amounts of salt to your foods. Try flavoring foods with herbs and spices instead of salt. Avoid adding sugar to foods. Try using oil-based dressings, sauces, and spreads instead of solid fats. This information is based on general [place] nutrition guidelines. For more information, visit [employer]. Exact amounts may vary based on your nutrition needs. Summary A healthy eating plan may help you to maintain a healthy weight, reduce the risk of chronic diseases, and stay active throughout your life. Plan your meals. Make sure you eat the right portions of a variety of nutrient-rich foods. Try baking, boiling, grilling, or broiling instead of frying. Choose healthy options in all settings, including home, work, school, restaurants, or stores. This information is not intended to replace advice given to you by your health care provider. Make sure you discuss any questions you have with your health care provider. Document Revised: 03/13/2021 Document Reviewed: 03/13/2021 Elsevier Patient Education  [employer].

## 2022-05-06 NOTE — Progress Notes (Signed)
Patient ID: Devin Andrews, male    DOB: 1986/06/10  MRN: [id number]  CC: Chronic disease management  Subjective: Devin Andrews is a 36 y.o. male who presents for chronic disease management His concerns today include:  Patient with history of HTN, DM, tob dep,  obesity, allergy to bee sting  DM:  Results for orders placed or performed in visit on 05/06/22  POCT glycosylated hemoglobin (Hb A1C)  Result Value Ref Range   Hemoglobin A1C     HbA1c POC (<> result, manual entry)     HbA1c, POC (prediabetic range)     HbA1c, POC (controlled diabetic range) 7.1 (A) 0.0 - 7.0 %    A1C 7.1 Diagnosed with diabetes on last visit.  We started him on low-dose of metformin. Taking med consistently. Trying to eat better.  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 Limits salt in the foods. He has BP device.  Reports BP has been a little high recently  No chest pains, shortness of breath, lower extremity edema. No HA/dizziness.  Tob dep: Indicated desire to quit on last visit.  We prescribed the nicotine patches.  Still smoking 5 cigarettes a day.  Using patches inconsistently because it causes him to itch  Request screening for STDs.  Denies any dysuria, penile discharge or genital itching at this time.  Patient Active Problem List   Diagnosis Date Noted   Diabetes mellitus type 2 in obese (HCC) 10/25/2021   Major depressive disorder, single episode, mild (HCC) 10/25/2021   Tobacco dependence 09/18/2018   Essential hypertension 10/10/2014     Current Outpatient Medications on File Prior to Visit  Medication Sig Dispense Refill   atorvastatin (LIPITOR) 20 MG tablet Take 1 tablet (20 mg total) by mouth daily. 90 tablet 1    sertraline (ZOLOFT) 25 MG tablet Take 1 tablet (25 mg total) by mouth daily. 30 tablet 3   EPINEPHrine (EPIPEN 2-PAK) 0.3 mg/0.3 mL IJ SOAJ injection Inject 0.3 mg into the muscle as needed for anaphylaxis. Severe allergic reaction/bee stings (Patient not taking: Reported on 05/06/2022) 1 each 0   No current facility-administered medications on file prior to visit.    Allergies  Allergen Reactions   Bee Venom Other (See Comments)    Passes Out, Dizziness Only "Dirt-Dobbers" - wasps    Social History   Socioeconomic History   Marital status: Single    Spouse name: Not on file   Number of children: Not on file   Years of education: Not on file   Highest education level: Not on file  Occupational History   Not on file  Tobacco Use   Smoking status: Every Day    Packs/day: 0.50    Types: Cigarettes   Smokeless tobacco: Former  Substance and Sexual Activity   Alcohol use: Yes    Comment: occ   Drug use: No   Sexual activity: Yes  Other Topics Concern   Not on file  Social History Narrative   Spouse Dorene Grebe is pregnant currently 09/18/2018   Social Determinants of Health   Financial Resource Strain: Not on file  Food Insecurity: No Food Insecurity (11/21/2021)   Hunger Vital Sign  Worried About Programme researcher, broadcasting/film/video in the Last Year: Never true    Ran Out of Food in the Last Year: Never true  Transportation Needs: Not on file  Physical Activity: Not on file  Stress: Not on file  Social Connections: Not on file  Intimate Partner Violence: Not on file    Family History  Problem Relation Age of Onset   Diabetes Mother    Hypertension Mother     Past Surgical History:  Procedure Laterality Date   NO PAST SURGERIES      ROS: Review of Systems Negative except as stated above  PHYSICAL EXAM: BP (!) 170/110   Pulse 76   Ht 6\' 3"  (1.905 m)   Wt (!) 305 lb 12.8 oz (138.7 kg)   SpO2 100%   BMI 38.22 kg/m   Physical Exam  General appearance - alert, well  appearing, and in no distress Mental status - normal mood, behavior, speech, dress, motor activity, and thought processes Neck - supple, no significant adenopathy Chest - clear to auscultation, no wheezes, rales or rhonchi, symmetric air entry Heart - normal rate, regular rhythm, normal S1, S2, no murmurs, rubs, clicks or gallops Extremities - peripheral pulses normal, no pedal edema, no clubbing or cyanosis Diabetic Foot Exam - Simple   Simple Foot Form Diabetic Foot exam was performed with the following findings: Yes 05/06/2022  3:10 PM  Visual Inspection See comments: Yes Sensation Testing Intact to touch and monofilament testing bilaterally: Yes Pulse Check Posterior Tibialis and Dorsalis pulse intact bilaterally: Yes Comments Slightly flat-footed.  Dry skin around the plantar heels.  Toenails thickened discolored especially on the big toes.         Latest Ref Rng & Units 10/25/2021   11:32 AM 12/06/2020    9:05 AM 09/18/2018    4:43 PM  CMP  Glucose 70 - 99 mg/dL 09/20/2018  440  347   BUN 6 - 20 mg/dL 8  11  15    Creatinine 0.76 - 1.27 mg/dL 425   9.56   Sodium 134 - 144 mmol/L 140  137  140   Potassium 3.5 - 5.2 mmol/L 4.5  4.0  4.1   Chloride 96 - 106 mmol/L 103  103  100   CO2 20 - 29 mmol/L 26  27  24    Calcium 8.7 - 10.2 mg/dL 9.9  9.0  9.7   Total Protein 6.0 - 8.5 g/dL 7.4  7.2  7.0   Total Bilirubin 0.0 - 1.2 mg/dL 0.8  0.7  3.87   Alkaline Phos 44 - 121 IU/L 121  99  115   AST 0 - 40 IU/L 39  47  38   ALT 0 - 44 IU/L 40  53  51    Lipid Panel     Component Value Date/Time   CHOL 181 10/25/2021 1132   TRIG 73 10/25/2021 1132   HDL 45 10/25/2021 1132   CHOLHDL 4.0 10/25/2021 1132   LDLCALC 122 (H) 10/25/2021 1132    CBC    Component Value Date/Time   WBC 11.4 (H) 10/25/2021 1132   WBC 9.6 12/06/2020 0905   RBC 5.31 10/25/2021 1132   RBC 5.13 12/06/2020 0905   HGB 14.1 10/25/2021 1132   HCT 43.5 10/25/2021 1132   PLT 212 10/25/2021 1132   MCV 82  10/25/2021 1132   MCH 26.6 10/25/2021 1132   MCH 26.1 12/06/2020 0905   MCHC 32.4 10/25/2021 1132   MCHC 30.9  12/06/2020 0905   RDW 14.1 10/25/2021 1132    ASSESSMENT AND PLAN: 1. Diabetes mellitus type 2 in obese (HCC) A1c has improved and is close to goal. Encouraged him to continue to remain active. Dietary counseling given and printed information given also. Increase metformin to 1000 mg daily. - POCT glycosylated hemoglobin (Hb A1C) - Ambulatory referral to Ophthalmology - metFORMIN (GLUCOPHAGE) 1000 MG tablet; Take 1 tablet (1,000 mg total) by mouth daily with breakfast.  Dispense: 90 tablet; Refill: 1  2. Hypertension associated with type 2 diabetes mellitus ([place]) Not at goal being 130/80 or lower.  I recommend increasing the Diovan to 160 mg daily.  Continue amlodipine 10 mg daily.  3. Hyperlipidemia associated with type 2 diabetes mellitus (HCC) Continue atorvastatin 20 mg daily.  Due for lipid profile check. - Lipid panel  4. Tobacco dependence Patient would like to try Chantix since he did not tolerate the nicotine patches.  I went over with him about the initiation pack for 1 month then the continuation pack for 2 months. - Varenicline Tartrate, Starter, (CHANTIX STARTING MONTH PAK) 0.5 MG X 11 & 1 MG X 42 TBPK; Use and directed on packet.  Dispense: 53 each; Refill: 0  5. Routine screening for STI (sexually transmitted infection) - RPR - Urine cytology ancillary only  6. Need for hepatitis C screening test - Hepatitis C Antibody  7. Screening for HIV (human immunodeficiency virus) - HIV antibody (with reflex)  8. Need for Tdap vaccination Given today.  9. Influenza vaccination declined Recommended..  Patient declined.      Patient was given the opportunity to ask questions.  Patient verbalized understanding of the plan and was able to repeat key elements of the plan.   This documentation was completed using Radio producer.  Any  transcriptional errors are unintentional.  Orders Placed This Encounter  Procedures   Hepatitis C Antibody   HIV antibody (with reflex)   RPR   Lipid panel   Ambulatory referral to Ophthalmology   POCT glycosylated hemoglobin (Hb A1C)     Requested Prescriptions   Signed Prescriptions Disp Refills   amLODipine (NORVASC) 10 MG tablet 30 tablet 6    Sig: Take 1 tablet (10 mg total) by mouth daily.   metFORMIN (GLUCOPHAGE) 1000 MG tablet 90 tablet 1    Sig: Take 1 tablet (1,000 mg total) by mouth daily with breakfast.   valsartan (DIOVAN) 160 MG tablet 30 tablet 6    Sig: Take 1 tablet (160 mg total) by mouth daily.   Varenicline Tartrate, Starter, (CHANTIX STARTING MONTH PAK) 0.5 MG X 11 & 1 MG X 42 TBPK 53 each 0    Sig: Use and directed on packet.    Return in about 4 months (around 09/06/2022) for Appt with [facility] in 2 wks for BP check.  Karle Plumber, MD, FACP

## 2022-05-07 ENCOUNTER — Other Ambulatory Visit: Payer: Self-pay

## 2022-05-07 LAB — URINE CYTOLOGY ANCILLARY ONLY
Chlamydia: NEGATIVE
Comment: NEGATIVE
Comment: NEGATIVE
Comment: NORMAL
Neisseria Gonorrhea: NEGATIVE
Trichomonas: NEGATIVE

## 2022-05-08 LAB — LIPID PANEL
Chol/HDL Ratio: 3.3 ratio (ref 0.0–5.0)
Cholesterol, Total: 142 mg/dL (ref 100–199)
HDL: 43 mg/dL (ref >39)
LDL Chol Calc (NIH): 76 mg/dL (ref 0–99)
Triglycerides: 127 mg/dL (ref 0–149)
VLDL Cholesterol Cal: 23 mg/dL (ref 5–40)

## 2022-05-08 LAB — RPR: RPR Ser Ql: NONREACTIVE

## 2022-05-08 LAB — HIV ANTIBODY (ROUTINE TESTING W REFLEX): HIV Screen 4th Generation wRfx: NONREACTIVE

## 2022-05-08 LAB — HEPATITIS C ANTIBODY: Hep C Virus Ab: NONREACTIVE

## 2022-05-13 ENCOUNTER — Telehealth: Payer: Commercial Managed Care - HMO | Admitting: Internal Medicine

## 2022-05-13 NOTE — Telephone Encounter (Signed)
Patient called in about referral to Ophthamology at Dr Zenia Resides office. He states no appt available until March and is trying ot get something sooner. Please call back

## 2022-05-13 NOTE — Telephone Encounter (Signed)
Left message on voicemail to return call.  -will mail list for eye specialist to address.

## 2022-05-20 NOTE — Telephone Encounter (Signed)
Left message on voicemail regarding referral and that eye resource form will be mailed to address.

## 2022-05-29 NOTE — Progress Notes (Unsigned)
   S:     PCP: Dr. Wynetta Emery  36 y.o. male who presents for hypertension evaluation, education, and management.  PMH is significant for HTN, T2DM, tobacco dependence, and MDD .  Patient was initially referred by Primary Care Provider, Dr. Wynetta Emery, on 10/25/2021.  BP at that was was consistent with hypertensive urgency, however, pt admitted to being without medication for ~1 yr. Last appointment with pharmacist 12/04/21 where BP was 156/95 and amlodipine was increased from 5mg  to 10mg .   He has been seen by the clinical pharmacist this year for antihypertensive medications titration.   At last visit with PCP on 10/9, BP still elevated at 170/110 and 171/121 respectively.  Valsartan dose was increased at that time.   Today, patient arrives in *** spirits and presents without *** assistance. *** Denies dizziness, headache, blurred vision, swelling.   Patient reports hypertension is longstanding  Family/Social history:  - Fhx: stroke - Tobacco: *** - Alcohol: none reported  Medication adherence *** . Patient has *** taken BP medications today.   Current antihypertensives include: amlodipine 10mg  once daily, valsartan 160mg  once daily   Antihypertensives tried in the past include: chlorthalidone  Reported home BP readings: ***  Patient reported dietary habits: Eats *** meals/day Breakfast: *** Lunch: *** Dinner: *** Snacks: *** Drinks: ***  Patient-reported exercise habits: ***  ASCVD risk factors include: ***  O:  ROS  Physical Exam  Last 3 Office BP readings: BP Readings from Last 3 Encounters:  05/06/22 (!) 170/110  01/04/22 (!) 151/102  12/04/21 (!) 156/95    BMET    Component Value Date/Time   NA 140 10/25/2021 1132   K 4.5 10/25/2021 1132   CL 103 10/25/2021 1132   CO2 26 10/25/2021 1132   GLUCOSE 113 (H) 10/25/2021 1132   GLUCOSE 133 (H) 12/06/2020 0905   BUN 8 10/25/2021 1132   CREATININE 1.13 10/25/2021 1132   CALCIUM 9.9 10/25/2021 1132   GFRNONAA >60  12/06/2020 0905   GFRAA 105 09/18/2018 1643    Renal function: CrCl cannot be calculated (Patient's most recent lab result is older than the maximum 21 days allowed.).  Clinical ASCVD: {YES/NO:21197} The ASCVD Risk score (Arnett DK, et al., 2019) failed to calculate for the following reasons:   The 2019 ASCVD risk score is only valid for ages 54 to 38  Patient is participating in a Managed Medicaid Plan:  {MM YES/NO:27447::"Yes"}    A/P: Hypertension diagnosed *** currently *** on current medications. BP goal < 130/80 *** mmHg. Medication adherence appears ***. Control is suboptimal due to ***.  -{Meds adjust:18428} ***.  -Patient educated on purpose, proper use, and potential adverse effects of ***.  -F/u labs ordered - *** -Counseled on lifestyle modifications for blood pressure control including reduced dietary sodium, increased exercise, adequate sleep. -Encouraged patient to check BP at home and bring log of readings to next visit. Counseled on proper use of home BP cuff.    Results reviewed and written information provided.    Written patient instructions provided. Patient verbalized understanding of treatment plan.  Total time in face to face counseling *** minutes.    Follow-up:  Pharmacist ***. PCP clinic visit in February  Maryan Puls, PharmD PGY-1 [facility] Resident

## 2022-05-30 ENCOUNTER — Other Ambulatory Visit: Payer: Self-pay

## 2022-05-30 ENCOUNTER — Ambulatory Visit: Payer: Commercial Managed Care - HMO | Attending: Internal Medicine | Admitting: Pharmacist

## 2022-05-30 VITALS — BP 169/118 | HR 76

## 2022-05-30 DIAGNOSIS — E1159 Type 2 diabetes mellitus with other circulatory complications: Secondary | ICD-10-CM | POA: Diagnosis not present

## 2022-05-30 DIAGNOSIS — I152 Hypertension secondary to endocrine disorders: Secondary | ICD-10-CM | POA: Diagnosis not present

## 2022-05-30 MED ORDER — VALSARTAN 320 MG PO TABS
320.0000 mg | ORAL_TABLET | Freq: Every day | ORAL | 0 refills | Status: DC
  Filled 2022-05-30: qty 30, 30d supply, fill #0
  Filled 2022-06-24 – 2022-07-24 (×3): qty 30, 30d supply, fill #1

## 2022-05-31 LAB — BMP8+EGFR
BUN/Creatinine Ratio: 12 (ref 9–20)
BUN: 13 mg/dL (ref 6–20)
CO2: 24 mmol/L (ref 20–29)
Calcium: 10 mg/dL (ref 8.7–10.2)
Chloride: 105 mmol/L (ref 96–106)
Creatinine, Ser: 1.09 mg/dL (ref 0.76–1.27)
Glucose: 144 mg/dL — ABNORMAL HIGH (ref 70–99)
Potassium: 4.4 mmol/L (ref 3.5–5.2)
Sodium: 147 mmol/L — ABNORMAL HIGH (ref 134–144)
eGFR: 90 mL/min/1.73 (ref >59)

## 2022-06-05 ENCOUNTER — Other Ambulatory Visit: Payer: Self-pay

## 2022-06-06 ENCOUNTER — Other Ambulatory Visit: Payer: Self-pay

## 2022-06-11 ENCOUNTER — Other Ambulatory Visit: Payer: Self-pay

## 2022-06-20 ENCOUNTER — Other Ambulatory Visit: Payer: Self-pay | Admitting: Internal Medicine

## 2022-06-21 ENCOUNTER — Other Ambulatory Visit (HOSPITAL_COMMUNITY): Payer: Self-pay

## 2022-06-21 MED ORDER — SERTRALINE HCL 25 MG PO TABS
25.0000 mg | ORAL_TABLET | Freq: Every day | ORAL | 3 refills | Status: AC
  Filled 2022-06-21 – 2022-07-24 (×4): qty 30, 30d supply, fill #0

## 2022-06-21 NOTE — Telephone Encounter (Signed)
Requested Prescriptions  Pending Prescriptions Disp Refills   sertraline (ZOLOFT) 25 MG tablet 30 tablet 3    Sig: Take 1 tablet (25 mg total) by mouth daily.     Psychiatry:  Antidepressants - SSRI - sertraline Passed - 06/20/2022  1:07 PM      Passed - AST in normal range and within 360 days    AST  Date Value Ref Range Status  10/25/2021 39 0 - 40 IU/L Final         Passed - ALT in normal range and within 360 days    ALT  Date Value Ref Range Status  10/25/2021 40 0 - 44 IU/L Final         Passed - Completed PHQ-2 or PHQ-9 in the last 360 days      Passed - Valid encounter within last 6 months    Recent Outpatient Visits           3 weeks ago Hypertension associated with type 2 diabetes mellitus [facility])   [facility] [place], Jeannett Senior L, RPH-CPP   1 month ago Diabetes mellitus type 2 in obese [facility])   [facility] Marcine Matar, MD   5 months ago Essential hypertension   [facility] Lois Huxley, Cornelius Moras, RPH-CPP   6 months ago Essential hypertension   [facility] Lois Huxley, Cornelius Moras, RPH-CPP   7 months ago Establishing care with new doctor, encounter for   [facility] Marcine Matar, MD       Future Appointments             In 2 weeks Lois Huxley, Cornelius Moras, RPH-CPP [facility]   In 2 months Laural Benes, Binnie Rail, MD [facility]

## 2022-06-25 ENCOUNTER — Other Ambulatory Visit: Payer: Self-pay

## 2022-06-26 ENCOUNTER — Other Ambulatory Visit: Payer: Self-pay

## 2022-06-28 ENCOUNTER — Other Ambulatory Visit (HOSPITAL_COMMUNITY): Payer: Self-pay

## 2022-07-01 ENCOUNTER — Other Ambulatory Visit: Payer: Self-pay

## 2022-07-01 LAB — HEPATITIS B SURFACE ANTIBODY,QUALITATIVE

## 2022-07-03 ENCOUNTER — Other Ambulatory Visit: Payer: Self-pay

## 2022-07-05 ENCOUNTER — Ambulatory Visit: Payer: Commercial Managed Care - HMO | Attending: Pharmacist | Admitting: Pharmacist

## 2022-07-23 ENCOUNTER — Other Ambulatory Visit (HOSPITAL_COMMUNITY): Payer: Self-pay

## 2022-07-23 ENCOUNTER — Other Ambulatory Visit: Payer: Self-pay

## 2022-07-24 ENCOUNTER — Encounter (HOSPITAL_COMMUNITY): Payer: Self-pay

## 2022-07-24 ENCOUNTER — Other Ambulatory Visit (HOSPITAL_COMMUNITY): Payer: Self-pay

## 2022-07-24 ENCOUNTER — Other Ambulatory Visit: Payer: Self-pay

## 2022-07-25 ENCOUNTER — Other Ambulatory Visit: Payer: Self-pay

## 2022-09-06 ENCOUNTER — Ambulatory Visit: Payer: Commercial Managed Care - HMO | Admitting: Internal Medicine

## 2022-11-21 ENCOUNTER — Other Ambulatory Visit: Payer: Self-pay | Admitting: Pharmacist

## 2022-11-25 NOTE — Progress Notes (Signed)
Patient attempted to be outreached by Coralyn Helling, PharmD Candidate on 11/21/2022 to discuss hypertension. Repeatedly got "the call cannot be completed as dialed" error.   Coralyn Helling, PharmD Candidate   Alvino Blood, PharmD, BCACP

## 2023-03-27 ENCOUNTER — Telehealth: Payer: Self-pay | Admitting: Pharmacist

## 2023-03-27 NOTE — Telephone Encounter (Signed)
Called patient to schedule an appointment for a BP check. I was unable to reach the patient so I left a HIPAA-compliant message requesting that the patient return my call.   Butch Penny, PharmD, Patsy [place], CPP Clinical Pharmacist [facility] [phone]

## 2023-04-09 ENCOUNTER — Telehealth: Payer: Self-pay | Admitting: *Deleted

## 2023-04-09 NOTE — Telephone Encounter (Signed)
Called patient to schedule an appointment for a BP check. I was unable to reach the patient so I left a HIPAA-compliant message requesting that the patient return my call.   May attempt to schedule an apt for him on available slot in September or on the Saturday clinic September 21st.

## 2023-06-12 ENCOUNTER — Other Ambulatory Visit: Payer: Self-pay | Admitting: Internal Medicine

## 2023-06-12 DIAGNOSIS — E669 Obesity, unspecified: Secondary | ICD-10-CM

## 2023-06-16 ENCOUNTER — Ambulatory Visit: Payer: Commercial Managed Care - HMO | Admitting: Internal Medicine

## 2023-06-18 ENCOUNTER — Other Ambulatory Visit: Payer: Self-pay

## 2023-06-18 ENCOUNTER — Encounter (HOSPITAL_BASED_OUTPATIENT_CLINIC_OR_DEPARTMENT_OTHER): Payer: Self-pay

## 2023-06-18 ENCOUNTER — Emergency Department (HOSPITAL_BASED_OUTPATIENT_CLINIC_OR_DEPARTMENT_OTHER)
Admission: EM | Admit: 2023-06-18 | Discharge: 2023-06-18 | Disposition: A | Discharge status: Home / Self Care | Payer: 59 | Attending: Emergency Medicine | Admitting: Emergency Medicine

## 2023-06-18 ENCOUNTER — Emergency Department (HOSPITAL_BASED_OUTPATIENT_CLINIC_OR_DEPARTMENT_OTHER): Payer: 59 | Admitting: Radiology

## 2023-06-18 DIAGNOSIS — Z79899 Other long term (current) drug therapy: Secondary | ICD-10-CM | POA: Insufficient documentation

## 2023-06-18 DIAGNOSIS — I10 Essential (primary) hypertension: Secondary | ICD-10-CM | POA: Diagnosis not present

## 2023-06-18 DIAGNOSIS — Z7984 Long term (current) use of oral hypoglycemic drugs: Secondary | ICD-10-CM | POA: Insufficient documentation

## 2023-06-18 DIAGNOSIS — F17200 Nicotine dependence, unspecified, uncomplicated: Secondary | ICD-10-CM | POA: Diagnosis not present

## 2023-06-18 DIAGNOSIS — J9811 Atelectasis: Secondary | ICD-10-CM | POA: Diagnosis not present

## 2023-06-18 DIAGNOSIS — R011 Cardiac murmur, unspecified: Secondary | ICD-10-CM | POA: Diagnosis not present

## 2023-06-18 DIAGNOSIS — R0789 Other chest pain: Secondary | ICD-10-CM

## 2023-06-18 DIAGNOSIS — R030 Elevated blood-pressure reading, without diagnosis of hypertension: Secondary | ICD-10-CM | POA: Diagnosis not present

## 2023-06-18 DIAGNOSIS — E119 Type 2 diabetes mellitus without complications: Secondary | ICD-10-CM | POA: Insufficient documentation

## 2023-06-18 HISTORY — DX: Pure hypercholesterolemia, unspecified: E78.00

## 2023-06-18 LAB — BASIC METABOLIC PANEL
Anion gap: 9 (ref 5–15)
BUN: 17 mg/dL (ref 6–20)
CO2: 27 mmol/L (ref 22–32)
Calcium: 9.5 mg/dL (ref 8.9–10.3)
Chloride: 102 mmol/L (ref 98–111)
Creatinine, Ser: 1.18 mg/dL (ref 0.61–1.24)
GFR, Estimated: >60 mL/min (ref >60)
Glucose, Bld: 240 mg/dL — ABNORMAL HIGH (ref 70–99)
Potassium: 3.8 mmol/L (ref 3.5–5.1)
Sodium: 138 mmol/L (ref 135–145)

## 2023-06-18 LAB — CBC
HCT: 41.7 % (ref 39.0–52.0)
Hemoglobin: 13.3 g/dL (ref 13.0–17.0)
MCH: 27 pg (ref 26.0–34.0)
MCHC: 31.9 g/dL (ref 30.0–36.0)
MCV: 84.8 fL (ref 80.0–100.0)
Platelets: 185 10*3/uL (ref 150–400)
RBC: 4.92 MIL/uL (ref 4.22–5.81)
RDW: 16.2 % — ABNORMAL HIGH (ref 11.5–15.5)
WBC: 9.7 10*3/uL (ref 4.0–10.5)
nRBC: 0 % (ref 0.0–0.2)

## 2023-06-18 LAB — TROPONIN I (HIGH SENSITIVITY)
Troponin I (High Sensitivity): 35 ng/L — ABNORMAL HIGH (ref <18)
Troponin I (High Sensitivity): 35 ng/L — ABNORMAL HIGH (ref <18)

## 2023-06-18 MED ORDER — AMLODIPINE BESYLATE 5 MG PO TABS: 5.0000 mg | ORAL_TABLET | Freq: Every day | ORAL | 2 refills | Status: DC

## 2023-06-18 NOTE — ED Provider Notes (Signed)
[place] EMERGENCY DEPARTMENT AT [facility] Provider Note   CSN: [id number] Arrival date & time: 06/18/23  1840     History  Chief Complaint  Patient presents with   Hypertension    Devin Andrews is a 37 y.o. male with past medical history significant for hypertension, tobacco dependence, diabetes, depression, hyperlipidemia presents to the ED complaining of elevated blood pressure and chest tightness and discomfort with mild shortness of breath.  Patient reports occasional wheezing with cough, but no history of asthma.  Patient was on blood pressure medication, but has not been taking them for the past year.  He reports he decided to stop taking them voluntarily.  Endorses intermittent headache and blurry vision, but is not currently having these symptoms.  Denies dizziness, light-headedness, syncope, leg swelling, palpitations.  Patient went to [facility] and was referred to ED for elevated BP, reported to be 200s systolic.         Home Medications Prior to Admission medications   Medication Sig Start Date End Date Taking? Authorizing Provider  amLODipine (NORVASC) 10 MG tablet Take 1 tablet (10 mg total) by mouth daily. 05/06/22   Marcine Matar, MD  atorvastatin (LIPITOR) 20 MG tablet Take 1 tablet (20 mg total) by mouth daily. 10/27/21   Marcine Matar, MD  EPINEPHrine (EPIPEN 2-PAK) 0.3 mg/0.3 mL IJ SOAJ injection Inject 0.3 mg into the muscle as needed for anaphylaxis. Severe allergic reaction/bee stings Patient not taking: Reported on 05/06/2022 12/06/20   Raspet, Noberto Retort, PA-C  metFORMIN (GLUCOPHAGE) 1000 MG tablet Take 1 tablet (1,000 mg total) by mouth daily with breakfast. 05/06/22   Marcine Matar, MD  sertraline (ZOLOFT) 25 MG tablet Take 1 tablet (25 mg total) by mouth daily. 06/21/22   Marcine Matar, MD  valsartan (DIOVAN) 320 MG tablet Take 1 tablet (320 mg total) by mouth daily. 05/30/22   Marcine Matar, MD  Varenicline Tartrate,  Starter, (CHANTIX STARTING MONTH PAK) 0.5 MG X 11 & 1 MG X 42 TBPK Use and directed on packet. 05/06/22   Marcine Matar, MD      Allergies    Bee venom    Review of Systems   Review of Systems  Eyes:  Positive for visual disturbance (intermittent blurred vision).  Respiratory:  Positive for cough, chest tightness, shortness of breath and wheezing.   Cardiovascular:  Positive for chest pain. Negative for palpitations and leg swelling.  Neurological:  Positive for headaches (intermittent). Negative for dizziness, syncope and light-headedness.    Physical Exam Updated Vital Signs BP (!) 154/94   Pulse (!) 58   Temp 97.9 F (36.6 C)   Resp 16   Ht 6\' 3"  (1.905 m)   Wt (!) 140.6 kg   SpO2 95%   BMI 38.75 kg/m  Physical Exam Vitals and nursing note reviewed.  Constitutional:      General: He is not in acute distress.    Appearance: Normal appearance. He is not ill-appearing or diaphoretic.  Cardiovascular:     Rate and Rhythm: Normal rate and regular rhythm. No extrasystoles are present.    Heart sounds: Murmur heard.  Pulmonary:     Effort: Pulmonary effort is normal. No tachypnea, accessory muscle usage or respiratory distress.     Breath sounds: Normal breath sounds and air entry. No wheezing.  Musculoskeletal:     Right lower leg: No edema.     Left lower leg: No edema.  Skin:    General: Skin  is warm and dry.     Capillary Refill: Capillary refill takes less than 2 seconds.  Neurological:     Mental Status: He is alert. Mental status is at baseline.  Psychiatric:        Mood and Affect: Mood normal.        Behavior: Behavior normal.     ED Results / Procedures / Treatments   Labs (all labs ordered are listed, but only abnormal results are displayed) Labs Reviewed  BASIC METABOLIC PANEL - Abnormal; Notable for the following components:      Result Value   Glucose, Bld 240 (*)    All other components within normal limits  CBC - Abnormal; Notable for the  following components:   RDW 16.2 (*)    All other components within normal limits  TROPONIN I (HIGH SENSITIVITY) - Abnormal; Notable for the following components:   Troponin I (High Sensitivity) 35 (*)    All other components within normal limits  TROPONIN I (HIGH SENSITIVITY)    EKG EKG Interpretation Date/Time:  Wednesday June 18 2023 19:15:10 EST Ventricular Rate:  65 PR Interval:  170 QRS Duration:  92 QT Interval:  470 QTC Calculation: 488 R Axis:   97  Text Interpretation: Normal sinus rhythm Rightward axis Cannot rule out Anterior infarct , age undetermined When compared with ECG of 10-Feb-2010 17:46, Minimal criteria for Anterior infarct are now Present Non-specific change in ST segment in Inferior leads Non-specific change in ST segment in Lateral leads T wave inversion now evident in Lateral leads Confirmed by Gwyneth Sprout ([id number]) on 06/18/2023 7:37:41 PM  Radiology DG Chest 2 View  Result Date: 06/18/2023 CLINICAL DATA:  Elevated blood pressure. EXAM: CHEST - 2 VIEW COMPARISON:  None Available. FINDINGS: The cardiac silhouette is mildly enlarged. Mild atelectasis is seen within the left lung base. No pleural effusion or pneumothorax is identified. The visualized skeletal structures are unremarkable. IMPRESSION: Mild left basilar atelectasis. Electronically Signed   By: Aram Candela M.D.   On: 06/18/2023 21:31    Procedures Procedures    Medications Ordered in ED Medications - No data to display  ED Course/ Medical Decision Making/ A&P                                 Medical Decision Making Amount and/or Complexity of Data Reviewed Labs: ordered. Radiology: ordered.   This patient presents to the ED with chief complaint(s) of chest tightness and discomfort, shortness of breath, elevated BP with pertinent past medical history of hypertension, diabetes, hyperlipidemia.  The complaint involves an extensive differential diagnosis and also carries with  it a high risk of complications and morbidity.    The differential diagnosis includes ACS, hypertensive emergency, hypertensive urgency, uncontrolled hypertension    The initial plan is to obtain labs, ECG   Additional history obtained: Records reviewed Primary Care Documents; patient's Valsartan was increased last year to 320 mg daily.  Patient's PCP had reached out to him twice (August and September of this year) to have his blood pressure rechecked.    Initial Assessment:   Exam significant for overall well-appearing patient who is not in acute distress.  Blood pressure is elevated.  Heart rate normal in the 60s with regular rhythm.  Murmur appreciated.  Lungs clear to auscultation bilaterally.  Skin is warm and dry.  No lower extremity edema appreciated.   Independent ECG/labs interpretation:  The following labs  were independently interpreted:  CBC without leukocytosis or anemia.  Metabolic panel with elevated glucose, but no renal impairment or major electrolyte disturbance.   Initial troponin elevated at 35.  Will get delta troponin.   Independent visualization and interpretation of imaging: I independently visualized the following imaging with scope of interpretation limited to determining acute life threatening conditions related to emergency care: chest x-ray, which revealed no pleural effusion or infiltrate.  Heart is mildly enlarged with mild left basilar atelectasis.    Disposition:   10:00 PM Care of @PATIENTNAME @ transferred to [employer], PA-C at the end of my shift as the patient will require reassessment once labs/imaging have resulted. Patient presentation, ED course, and plan of care discussed with review of all pertinent labs and imaging. Please see his/her note for further details regarding further ED course and disposition. Plan at time of handoff is follow up on second troponin.  If downtrending or flat, patient appropriate for cardiology outpatient follow-up and  restarting anti-hypertensive medication.  If troponin continues to rise, he will require admission for cardiac workup.  This may be altered or completely changed at the discretion of the oncoming team pending results of further workup.   Social Determinants of Health:   Patient's  tobacco dependence  increases the complexity of managing their presentation         Final Clinical Impression(s) / ED Diagnoses Final diagnoses:  Uncontrolled hypertension    Rx / DC Orders ED Discharge Orders     None         Lenard Simmer, PA-C 06/18/23 2200    Gwyneth Sprout, MD 06/23/23 1203

## 2023-06-18 NOTE — ED Notes (Signed)
 RN reviewed discharge instructions with pt. Pt verbalized understanding and had no further questions. VSS upon discharge.  

## 2023-06-18 NOTE — Discharge Instructions (Addendum)
Please follow-up with your primary care doctor, I started you amlodipine, and you should follow-up with your PCP.  Take your blood pressure twice a day, and to make a log for your primary care doctor.  Return if you feel like your symptoms are worsening.  Your troponins are elevated, but flat, which are reassuring.  If you have worsening chest pain please return to the ER

## 2023-06-18 NOTE — ED Triage Notes (Addendum)
Pt has not taken BP medications x 1 year, states BP was in the 200's at home. Denies headache, blurry vision, dizziness at this time. Endorses chest pain and slight SOB. Chest pain started approx 1800

## 2023-06-18 NOTE — ED Provider Notes (Signed)
Handoff from [place], [place], patient pending second troponin, for possible admit for hypertensive emergency, versus management of hypertension outpatient.  If troponin is elevated, admit for hypertensive emergency, if troponin is downtrending/same, discharged with hypertensive meds. Physical Exam  BP (!) 168/104   Pulse 70   Temp 97.9 F (36.6 C)   Resp 18   Ht 6\' 3"  (1.905 m)   Wt (!) 140.6 kg   SpO2 99%   BMI 38.75 kg/m   Physical Exam Vitals and nursing note reviewed.  Constitutional:      General: He is not in acute distress.    Appearance: He is well-developed.  HENT:     Head: Normocephalic and atraumatic.  Eyes:     Conjunctiva/sclera: Conjunctivae normal.  Cardiovascular:     Rate and Rhythm: Normal rate and regular rhythm.     Heart sounds: No murmur heard. Pulmonary:     Effort: Pulmonary effort is normal. No respiratory distress.     Breath sounds: Normal breath sounds.  Abdominal:     Palpations: Abdomen is soft.     Tenderness: There is no abdominal tenderness.  Musculoskeletal:        General: No swelling.     Cervical back: Neck supple.  Skin:    General: Skin is warm and dry.     Capillary Refill: Capillary refill takes less than 2 seconds.  Neurological:     Mental Status: He is alert.  Psychiatric:        Mood and Affect: Mood normal.     Procedures  Procedures  ED Course / MDM    Medical Decision Making Patient is a 37 year old male, history of uncontrolled hypertension, who is no longer on his medications.  He has elevated blood pressure, initially 176/121, troponin was initially 35, and stayed stable at 35.  As Bobbye Riggs, PA, recommended, will discharge with close follow-up with PCP.  He is established, and primary care doctor has been trying to get a hold of him.  I will start him on amlodipine 5 mg, and have him follow-up with his PCP.  He states his chest pain is better now that his blood pressure has improved to 154/94.  He is well-appearing.   Heart score is 3.  Low risk, will have him follow-up with his PCP.  Amount and/or Complexity of Data Reviewed Labs: ordered. Radiology: ordered.  Risk Prescription drug management.           Pete Pelt, [place] 06/18/23 2249    Gwyneth Sprout, MD 06/23/23 1205

## 2023-06-19 ENCOUNTER — Other Ambulatory Visit: Payer: Self-pay

## 2023-07-09 ENCOUNTER — Encounter: Payer: Self-pay | Admitting: Physician Assistant

## 2023-07-09 ENCOUNTER — Ambulatory Visit: Payer: 59 | Attending: Physician Assistant | Admitting: Physician Assistant

## 2023-07-09 ENCOUNTER — Other Ambulatory Visit: Payer: Self-pay

## 2023-07-09 VITALS — BP 174/122 | HR 91 | Wt 300.8 lb

## 2023-07-09 DIAGNOSIS — I160 Hypertensive urgency: Secondary | ICD-10-CM | POA: Diagnosis not present

## 2023-07-09 DIAGNOSIS — E785 Hyperlipidemia, unspecified: Secondary | ICD-10-CM | POA: Diagnosis not present

## 2023-07-09 DIAGNOSIS — I152 Hypertension secondary to endocrine disorders: Secondary | ICD-10-CM

## 2023-07-09 DIAGNOSIS — E0865 Diabetes mellitus due to underlying condition with hyperglycemia: Secondary | ICD-10-CM | POA: Diagnosis not present

## 2023-07-09 DIAGNOSIS — E1169 Type 2 diabetes mellitus with other specified complication: Secondary | ICD-10-CM

## 2023-07-09 DIAGNOSIS — Z09 Encounter for follow-up examination after completed treatment for conditions other than malignant neoplasm: Secondary | ICD-10-CM

## 2023-07-09 DIAGNOSIS — E1165 Type 2 diabetes mellitus with hyperglycemia: Secondary | ICD-10-CM

## 2023-07-09 DIAGNOSIS — Z7984 Long term (current) use of oral hypoglycemic drugs: Secondary | ICD-10-CM

## 2023-07-09 DIAGNOSIS — E1159 Type 2 diabetes mellitus with other circulatory complications: Secondary | ICD-10-CM

## 2023-07-09 LAB — POCT GLYCOSYLATED HEMOGLOBIN (HGB A1C): HbA1c, POC (controlled diabetic range): 7.2 % — AB (ref 0.0–7.0)

## 2023-07-09 LAB — GLUCOSE, POCT (MANUAL RESULT ENTRY): POC Glucose: 124 mg/dL — AB (ref 70–99)

## 2023-07-09 MED ORDER — VALSARTAN 320 MG PO TABS
160.0000 mg | ORAL_TABLET | Freq: Every day | ORAL | 1 refills | Status: DC
  Filled 2023-07-09: qty 15, 30d supply, fill #0
  Filled 2023-07-20 – 2023-08-19 (×4): qty 15, 30d supply, fill #1

## 2023-07-09 MED ORDER — ATORVASTATIN CALCIUM 20 MG PO TABS
20.0000 mg | ORAL_TABLET | Freq: Every day | ORAL | 1 refills | Status: DC
  Filled 2023-07-09: qty 30, 30d supply, fill #0
  Filled 2023-08-07 – 2023-08-19 (×3): qty 30, 30d supply, fill #1
  Filled 2023-09-25: qty 30, 30d supply, fill #2
  Filled 2023-10-24: qty 30, 30d supply, fill #3
  Filled 2023-10-24: qty 90, 90d supply, fill #3
  Filled 2024-03-09: qty 30, 30d supply, fill #4
  Filled 2024-04-16: qty 30, 30d supply, fill #5

## 2023-07-09 MED ORDER — CLONIDINE HCL 0.1 MG PO TABS
0.1000 mg | ORAL_TABLET | Freq: Once | ORAL | Status: AC
  Administered 2023-07-09: 0.1 mg via ORAL

## 2023-07-09 MED ORDER — METFORMIN HCL 1000 MG PO TABS
1000.0000 mg | ORAL_TABLET | Freq: Every day | ORAL | 1 refills | Status: DC
  Filled 2023-07-09: qty 30, 30d supply, fill #0
  Filled 2023-08-07 – 2023-08-19 (×2): qty 30, 30d supply, fill #1
  Filled 2023-09-25: qty 30, 30d supply, fill #2
  Filled 2023-10-24: qty 90, 90d supply, fill #3
  Filled 2023-10-24: qty 30, 30d supply, fill #3
  Filled 2024-03-09: qty 30, 30d supply, fill #4
  Filled 2024-04-16: qty 30, 30d supply, fill #5

## 2023-07-09 MED ORDER — AMLODIPINE BESYLATE 10 MG PO TABS
10.0000 mg | ORAL_TABLET | Freq: Every day | ORAL | 1 refills | Status: DC
  Filled 2023-07-09: qty 30, 30d supply, fill #0
  Filled 2023-08-07 – 2023-08-19 (×3): qty 30, 30d supply, fill #1
  Filled 2023-09-25: qty 30, 30d supply, fill #2
  Filled 2023-10-24: qty 90, 90d supply, fill #3
  Filled 2023-10-24: qty 30, 30d supply, fill #3
  Filled 2024-03-09: qty 30, 30d supply, fill #4
  Filled 2024-04-16: qty 30, 30d supply, fill #5

## 2023-07-09 NOTE — Patient Instructions (Signed)
Check blood pressures daily and record.  Sit still and quiet for 5 mins before you check your blood pressure.    Drink 80 to 100 ounces water daily.  Hypertension, Adult High blood pressure (hypertension) is when the force of blood pumping through the arteries is too strong. The arteries are the blood vessels that carry blood from the heart throughout the body. Hypertension forces the heart to work harder to pump blood and may cause arteries to become narrow or stiff. Untreated or uncontrolled hypertension can lead to a heart attack, heart failure, a stroke, kidney disease, and other problems. A blood pressure reading consists of a higher number over a lower number. Ideally, your blood pressure should be below 120/80. The first ("top") number is called the systolic pressure. It is a measure of the pressure in your arteries as your heart beats. The second ("bottom") number is called the diastolic pressure. It is a measure of the pressure in your arteries as the heart relaxes. What are the causes? The exact cause of this condition is not known. There are some conditions that result in high blood pressure. What increases the risk? Certain factors may make you more likely to develop high blood pressure. Some of these risk factors are under your control, including: Smoking. Not getting enough exercise or physical activity. Being overweight. Having too much fat, sugar, calories, or salt (sodium) in your diet. Drinking too much alcohol. Other risk factors include: Having a personal history of heart disease, diabetes, high cholesterol, or kidney disease. Stress. Having a family history of high blood pressure and high cholesterol. Having obstructive sleep apnea. Age. The risk increases with age. What are the signs or symptoms? High blood pressure may not cause symptoms. Very high blood pressure (hypertensive crisis) may cause: Headache. Fast or irregular heartbeats (palpitations). Shortness of  breath. Nosebleed. Nausea and vomiting. Vision changes. Severe chest pain, dizziness, and seizures. How is this diagnosed? This condition is diagnosed by measuring your blood pressure while you are seated, with your arm resting on a flat surface, your legs uncrossed, and your feet flat on the floor. The cuff of the blood pressure monitor will be placed directly against the skin of your upper arm at the level of your heart. Blood pressure should be measured at least twice using the same arm. Certain conditions can cause a difference in blood pressure between your right and left arms. If you have a high blood pressure reading during one visit or you have normal blood pressure with other risk factors, you may be asked to: Return on a different day to have your blood pressure checked again. Monitor your blood pressure at home for 1 week or longer. If you are diagnosed with hypertension, you may have other blood or imaging tests to help your health care provider understand your overall risk for other conditions. How is this treated? This condition is treated by making healthy lifestyle changes, such as eating healthy foods, exercising more, and reducing your alcohol intake. You may be referred for counseling on a healthy diet and physical activity. Your health care provider may prescribe medicine if lifestyle changes are not enough to get your blood pressure under control and if: Your systolic blood pressure is above 130. Your diastolic blood pressure is above 80. Your personal target blood pressure may vary depending on your medical conditions, your age, and other factors. Follow these instructions at home: Eating and drinking  Eat a diet that is high in fiber and potassium, and  low in sodium, added sugar, and fat. An example of this eating plan is called the DASH diet. DASH stands for Dietary Approaches to Stop Hypertension. To eat this way: Eat plenty of fresh fruits and vegetables. Try to fill  one half of your plate at each meal with fruits and vegetables. Eat whole grains, such as whole-wheat pasta, brown rice, or whole-grain bread. Fill about one fourth of your plate with whole grains. Eat or drink low-fat dairy products, such as skim milk or low-fat yogurt. Avoid fatty cuts of meat, processed or cured meats, and poultry with skin. Fill about one fourth of your plate with lean proteins, such as fish, chicken without skin, beans, eggs, or tofu. Avoid pre-made and processed foods. These tend to be higher in sodium, added sugar, and fat. Reduce your daily sodium intake. Many people with hypertension should eat less than 1,500 mg of sodium a day. Do not drink alcohol if: Your health care provider tells you not to drink. You are pregnant, may be pregnant, or are planning to become pregnant. If you drink alcohol: Limit how much you have to: 0-1 drink a day for women. 0-2 drinks a day for men. Know how much alcohol is in your drink. In the [place] one drink equals one 12 oz bottle of beer (355 mL), one 5 oz glass of wine (148 mL), or one 1 oz glass of hard liquor (44 mL). Lifestyle  Work with your health care provider to maintain a healthy body weight or to lose weight. Ask what an ideal weight is for you. Get at least 30 minutes of exercise that causes your heart to beat faster (aerobic exercise) most days of the week. Activities may include walking, swimming, or biking. Include exercise to strengthen your muscles (resistance exercise), such as Pilates or lifting weights, as part of your weekly exercise routine. Try to do these types of exercises for 30 minutes at least 3 days a week. Do not use any products that contain nicotine or tobacco. These products include cigarettes, chewing tobacco, and vaping devices, such as e-cigarettes. If you need help quitting, ask your health care provider. Monitor your blood pressure at home as told by your health care provider. Keep all follow-up visits.  This is important. Medicines Take over-the-counter and prescription medicines only as told by your health care provider. Follow directions carefully. Blood pressure medicines must be taken as prescribed. Do not skip doses of blood pressure medicine. Doing this puts you at risk for problems and can make the medicine less effective. Ask your health care provider about side effects or reactions to medicines that you should watch for. Contact a health care provider if you: Think you are having a reaction to a medicine you are taking. Have headaches that keep coming back (recurring). Feel dizzy. Have swelling in your ankles. Have trouble with your vision. Get help right away if you: Develop a severe headache or confusion. Have unusual weakness or numbness. Feel faint. Have severe pain in your chest or abdomen. Vomit repeatedly. Have trouble breathing. These symptoms may be an emergency. Get help right away. Call 911. Do not wait to see if the symptoms will go away. Do not drive yourself to the hospital. Summary Hypertension is when the force of blood pumping through your arteries is too strong. If this condition is not controlled, it may put you at risk for serious complications. Your personal target blood pressure may vary depending on your medical conditions, your age, and other factors.  For most people, a normal blood pressure is less than 120/80. Hypertension is treated with lifestyle changes, medicines, or a combination of both. Lifestyle changes include losing weight, eating a healthy, low-sodium diet, exercising more, and limiting alcohol. This information is not intended to replace advice given to you by your health care provider. Make sure you discuss any questions you have with your health care provider. Document Revised: 05/22/2021 Document Reviewed: 05/22/2021 Elsevier Patient Education  2024 [employer].

## 2023-07-09 NOTE — Progress Notes (Signed)
Patient ID: Devin Andrews, male   DOB: 05/07/1986, 37 y.o.   MRN: [id number]    Devin Andrews, is a 37 y.o. male  [id number]  [id number]  DOB - November 17, 1985  Chief Complaint  Patient presents with   Hospitalization Follow-up       Subjective:   Devin Andrews is a 37 y.o. male here today for a follow up visit after being off all meds and no follow up for a long time.  He went to the ED 06/18/2023 for chest tightness and uncontrolled htn.  He was restarted on amlodipine but only at 5mg .  He has already doubled the dose himself and has been taking 10mg  for at least 1 week.  He has a BP cuff at home.  His dad died of a stroke 2 years ago and now he has decided he wants to take better care of himself.  He does a lot of walking at work and does not have CP then.  He has had no further CP since ED visit.     From ED note: Devin Andrews is a 37 y.o. male with past medical history significant for hypertension, tobacco dependence, diabetes, depression, hyperlipidemia presents to the ED complaining of elevated blood pressure and chest tightness and discomfort with mild shortness of breath.  Patient reports occasional wheezing with cough, but no history of asthma.  Patient was on blood pressure medication, but has not been taking them for the past year.  He reports he decided to stop taking them voluntarily.  Endorses intermittent headache and blurry vision, but is not currently having these symptoms.  Denies dizziness, light-headedness, syncope, leg swelling, palpitations.  Patient went to [facility] and was referred to ED for elevated BP, reported to be 200s systolic.     Disposition:   10:00 PM Care of @PATIENTNAME @ transferred to [facility], PA-C at the end of my shift as the patient will require reassessment once labs/imaging have resulted. Patient presentation, ED course, and plan of care discussed with review of all pertinent labs and imaging. Please see his/her note  for further details regarding further ED course and disposition. Plan at time of handoff is follow up on second troponin.  If downtrending or flat, patient appropriate for cardiology outpatient follow-up and restarting anti-hypertensive medication.  If troponin continues to rise, he will require admission for cardiac workup.  This may be altered or completely changed at the discretion of the oncoming team pending results of further workup.  No problems updated.  ALLERGIES: Allergies  Allergen Reactions   Bee Venom Other (See Comments)    Passes Out, Dizziness Only "Dirt-Dobbers" - wasps    PAST MEDICAL HISTORY: Past Medical History:  Diagnosis Date   Diabetes mellitus without complication (HCC)    Essential hypertension    High cholesterol     MEDICATIONS AT HOME: Prior to Admission medications   Medication Sig Start Date End Date Taking? Authorizing Provider  amLODipine (NORVASC) 10 MG tablet Take 1 tablet (10 mg total) by mouth daily. 07/09/23  Yes Georgian Co M, PA-C  atorvastatin (LIPITOR) 20 MG tablet Take 1 tablet (20 mg total) by mouth daily. 07/09/23   Anders Simmonds, PA-C  EPINEPHrine (EPIPEN 2-PAK) 0.3 mg/0.3 mL IJ SOAJ injection Inject 0.3 mg into the muscle as needed for anaphylaxis. Severe allergic reaction/bee stings Patient not taking: Reported on 05/06/2022 12/06/20   Raspet, Noberto Retort, PA-C  metFORMIN (GLUCOPHAGE) 1000 MG tablet Take 1 tablet (1,000 mg total) by mouth daily with breakfast.  07/09/23   Anders Simmonds, PA-C  sertraline (ZOLOFT) 25 MG tablet Take 1 tablet (25 mg total) by mouth daily. Patient not taking: Reported on 07/09/2023 06/21/22   Marcine Matar, MD  valsartan (DIOVAN) 320 MG tablet Take 0.5 tablets (160 mg total) by mouth daily. 07/09/23   Anders Simmonds, PA-C  Varenicline Tartrate, Starter, (CHANTIX STARTING MONTH PAK) 0.5 MG X 11 & 1 MG X 42 TBPK Use and directed on packet. Patient not taking: Reported on 07/09/2023 05/06/22   Marcine Matar, MD    ROS: Neg HEENT Neg resp Neg cardiac Neg GI Neg GU Neg MS Neg psych Neg neuro  Objective:   Vitals:   07/09/23 1631 07/09/23 1641  BP: (!) 201/140 (!) 205/143  Pulse: 91   SpO2: 98%   Weight: (!) 300 lb 12.8 oz (136.4 kg)    Exam General appearance : Awake, alert, not in any distress. Speech Clear. Not toxic looking HEENT: Atraumatic and Normocephalic, pupils equally reactive to light and accomodation, no obvious papilledema  Neck: Supple, no JVD. No cervical lymphadenopathy.  Chest: Good air entry bilaterally, CTAB.  No rales/rhonchi/wheezing CVS: S1 S2 regular, no murmurs.  Extremities: B/L Lower Ext shows no edema, both legs are warm to touch Neurology: Awake alert, and oriented X 3, CN II-XII intact, Non focal Skin: No Rash  Data Review Lab Results  Component Value Date   HGBA1C 7.2 (A) 07/09/2023   HGBA1C 7.1 (A) 05/06/2022   HGBA1C 7.7 (A) 10/25/2021    Assessment & Plan   1. Type 2 diabetes mellitus with hyperglycemia, without long-term current use of insulin (HCC) Resume metformin.   - Glucose (CBG) - HgB A1c - metFORMIN (GLUCOPHAGE) 1000 MG tablet; Take 1 tablet (1,000 mg total) by mouth daily with breakfast.  Dispense: 90 tablet; Refill: 1  2. Long term current use of oral hypoglycemic drug Will resume   3. Hypertension associated with type 2 diabetes mellitus (HCC) Uncontrolled with h/o noncompliance - amLODipine (NORVASC) 10 MG tablet; Take 1 tablet (10 mg total) by mouth daily.  Dispense: 90 tablet; Refill: 1 Will add 1/2 original dose and restart at 160-- valsartan (DIOVAN) 320 MG tablet; Take 0.5 tablets (160 mg total) by mouth daily.  Dispense: 45 tablet; Refill: 1 Labs ordered for next visit Check blood pressures daily and record.  Sit still and quiet for 5 mins before you check your blood pressure.    Drink 80 to 100 ounces water daily.  4. Hyperlipidemia associated with type 2 diabetes mellitus (HCC)  - atorvastatin  (LIPITOR) 20 MG tablet; Take 1 tablet (20 mg total) by mouth daily.  Dispense: 90 tablet; Refill: 1  5. Encounter for examination following treatment at hospital   6. Hypertensive urgency - cloNIDine (CATAPRES) tablet 0.1 mg BP started to come down in office after clonidine.    7. Diabetes mellitus due to underlying condition with hyperglycemia, without long-term current use of insulin (HCC) resume - metFORMIN (GLUCOPHAGE) 1000 MG tablet; Take 1 tablet (1,000 mg total) by mouth daily with breakfast.  Dispense: 90 tablet; Refill: 1    Return in about 6 weeks (around 08/20/2023) for [facility] for BP then PCP in 3 months([place]).  Labs ordered for visit with [facility]!  The patient was given clear instructions to go to ER or return to medical center if symptoms don't improve, worsen or new problems develop. The patient verbalized understanding. The patient was told to call to get lab results if they haven't  heard anything in the next week.      Georgian Co, PA-C [facility] [place], [place] [phone]   07/09/2023, 5:07 PM

## 2023-07-10 ENCOUNTER — Other Ambulatory Visit: Payer: Self-pay

## 2023-07-21 ENCOUNTER — Other Ambulatory Visit: Payer: Self-pay

## 2023-07-24 ENCOUNTER — Other Ambulatory Visit: Payer: Self-pay

## 2023-08-04 ENCOUNTER — Other Ambulatory Visit: Payer: Self-pay

## 2023-08-07 ENCOUNTER — Other Ambulatory Visit: Payer: Self-pay

## 2023-08-18 ENCOUNTER — Other Ambulatory Visit: Payer: Self-pay

## 2023-08-20 ENCOUNTER — Other Ambulatory Visit: Payer: Self-pay

## 2023-08-21 ENCOUNTER — Ambulatory Visit: Payer: No Typology Code available for payment source | Attending: Internal Medicine | Admitting: Pharmacist

## 2023-08-21 ENCOUNTER — Encounter: Payer: Self-pay | Admitting: Pharmacist

## 2023-08-21 ENCOUNTER — Other Ambulatory Visit: Payer: Self-pay

## 2023-08-21 VITALS — BP 196/135

## 2023-08-21 DIAGNOSIS — I10 Essential (primary) hypertension: Secondary | ICD-10-CM | POA: Diagnosis not present

## 2023-08-21 MED ORDER — VALSARTAN-HYDROCHLOROTHIAZIDE 320-25 MG PO TABS
1.0000 | ORAL_TABLET | Freq: Every day | ORAL | 1 refills | Status: DC
  Filled 2023-08-21: qty 90, 90d supply, fill #0
  Filled 2023-08-21 (×2): qty 30, 30d supply, fill #0
  Filled 2023-09-25: qty 30, 30d supply, fill #1
  Filled 2023-10-24: qty 90, 90d supply, fill #2
  Filled 2023-10-24: qty 30, 30d supply, fill #2
  Filled 2024-03-09: qty 30, 30d supply, fill #3
  Filled 2024-04-16: qty 30, 30d supply, fill #4

## 2023-08-21 NOTE — Progress Notes (Signed)
   S:     No chief complaint on file.   Devin Andrews is a 38 y.o. male who presents for hypertension evaluation, education, and management. PMH is significant for T2DM, HTN, tobacco dependence and MDD. Patient was referred and last seen by Georgian Co 07/09/2023. BP at that visit was 174/122.  Today, patient arrives in good spirits and presents without assistance. His BP remains critically elevated, however, he is asymptomatic. Specifically, he denies dizziness, headache, blurred vision, swelling. Patient reports hypertension is longstanding.   Family/Social history:  - Fhx: stroke - Tobacco: 0.5 PPD down from 1 PPD at last PCP visit. He has reduced daily use to 10/day since being on NRT. Has not smoked a cigarette today.  - Alcohol: none reported  Medication adherence reported. Patient has not taken BP medications today. Took them last night.    Current antihypertensives include: amlodipine 10 mg daily, valsartan 320 mg daily   Reported home BP readings: none with him today. Reports that his BP varies at home.   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   O:  Vitals:   08/21/23 1258  BP: (!) 196/135     Last 3 Office BP readings: BP Readings from Last 3 Encounters:  08/21/23 (!) 196/135  07/09/23 (!) 174/122  06/18/23 (!) 158/98    BMET    Component Value Date/Time   NA 138 06/18/2023 1933   NA 147 (H) 05/30/2022 1448   K 3.8 06/18/2023 1933   CL 102 06/18/2023 1933   CO2 27 06/18/2023 1933   GLUCOSE 240 (H) 06/18/2023 1933   BUN 17 06/18/2023 1933   BUN 13 05/30/2022 1448   CREATININE 1.18 06/18/2023 1933   CALCIUM 9.5 06/18/2023 1933   GFRNONAA >60 06/18/2023 1933   GFRAA 105 09/18/2018 1643    Renal function: CrCl cannot be calculated (Patient's most recent lab result is  older than the maximum 21 days allowed.).  Clinical ASCVD: No  The ASCVD Risk score (Arnett DK, et al., 2019) failed to calculate for the following reasons:   The 2019 ASCVD risk score is only valid for ages 73 to 82  A/P: Hypertension longstanding currently uncontrolled on current medications. He is in hypertensive urgency but no symptoms to suggest hypertensive emergency at this time. BP goal < 130/80 mmHg. Medication adherence appears appropriate.  -Continue amlodipine to 10 mg daily.  -Change valsartan to combination valsartan-hydrochlorothiazide 320-25 mg daily.  -Patient educated on purpose, proper use, and potential adverse effects of amlodipine, valsartan, and HCTZ.  -F/u labs ordered - CMP14+eGFR, lipid -Counseled on lifestyle modifications for blood pressure control including reduced dietary sodium, increased exercise, adequate sleep. -Encouraged patient to check BP at home and bring log of readings to next visit. Counseled on proper use of home BP cuff.   Results reviewed and written information provided. Patient verbalized understanding of treatment plan. Total time in face-to-face counseling 20 minutes.   F/u clinic visit in 1 month.  Butch Penny, PharmD, P[place]y [place], CPP Clinical Pharmacist [facility] [phone]

## 2023-08-22 ENCOUNTER — Telehealth: Payer: Self-pay

## 2023-08-22 LAB — CMP14+EGFR
ALT: 27 [IU]/L (ref 0–44)
AST: 23 [IU]/L (ref 0–40)
Albumin: 4.7 g/dL (ref 4.1–5.1)
Alkaline Phosphatase: 110 [IU]/L (ref 44–121)
BUN/Creatinine Ratio: 10 (ref 9–20)
BUN: 11 mg/dL (ref 6–20)
Bilirubin Total: 0.4 mg/dL (ref 0.0–1.2)
CO2: 24 mmol/L (ref 20–29)
Calcium: 9.6 mg/dL (ref 8.7–10.2)
Chloride: 103 mmol/L (ref 96–106)
Creatinine, Ser: 1.13 mg/dL (ref 0.76–1.27)
Globulin, Total: 2.5 g/dL (ref 1.5–4.5)
Glucose: 126 mg/dL — ABNORMAL HIGH (ref 70–99)
Potassium: 4.5 mmol/L (ref 3.5–5.2)
Sodium: 144 mmol/L (ref 134–144)
Total Protein: 7.2 g/dL (ref 6.0–8.5)
eGFR: 85 mL/min/{1.73_m2} (ref >59)

## 2023-08-22 LAB — LIPID PANEL
Chol/HDL Ratio: 3.1 {ratio} (ref 0.0–5.0)
Cholesterol, Total: 138 mg/dL (ref 100–199)
HDL: 45 mg/dL (ref >39)
LDL Chol Calc (NIH): 79 mg/dL (ref 0–99)
Triglycerides: 66 mg/dL (ref 0–149)
VLDL Cholesterol Cal: 14 mg/dL (ref 5–40)

## 2023-08-22 NOTE — Telephone Encounter (Signed)
Pt was called and vm was left, Information has been sent to nurse pool.

## 2023-08-22 NOTE — Telephone Encounter (Signed)
-----   Message from Georgian Co sent at 08/22/2023 10:23 AM EST ----- Please call patient. Kidneys, liver, electrolytes are normal. Cholesterol normal. Thanks, Georgian Co, PA-C

## 2023-09-25 ENCOUNTER — Other Ambulatory Visit: Payer: Self-pay

## 2023-09-26 ENCOUNTER — Other Ambulatory Visit: Payer: Self-pay

## 2023-10-09 ENCOUNTER — Ambulatory Visit: Admitting: Family Medicine

## 2023-10-09 ENCOUNTER — Ambulatory Visit: Payer: 59 | Admitting: Internal Medicine

## 2023-10-24 ENCOUNTER — Other Ambulatory Visit: Payer: Self-pay | Admitting: Internal Medicine

## 2023-10-24 ENCOUNTER — Other Ambulatory Visit: Payer: Self-pay

## 2023-10-30 ENCOUNTER — Other Ambulatory Visit: Payer: Self-pay

## 2023-11-03 ENCOUNTER — Other Ambulatory Visit: Payer: Self-pay

## 2024-03-05 ENCOUNTER — Encounter (HOSPITAL_COMMUNITY): Payer: Self-pay | Admitting: Emergency Medicine

## 2024-03-05 ENCOUNTER — Ambulatory Visit (HOSPITAL_COMMUNITY)
Admission: EM | Admit: 2024-03-05 | Discharge: 2024-03-05 | Disposition: A | Discharge status: Home / Self Care | Attending: Physician Assistant | Admitting: Physician Assistant

## 2024-03-05 DIAGNOSIS — R509 Fever, unspecified: Secondary | ICD-10-CM | POA: Insufficient documentation

## 2024-03-05 DIAGNOSIS — R112 Nausea with vomiting, unspecified: Secondary | ICD-10-CM | POA: Insufficient documentation

## 2024-03-05 LAB — POCT URINALYSIS DIP (MANUAL ENTRY)
Glucose, UA: NEGATIVE mg/dL
Leukocytes, UA: NEGATIVE
Nitrite, UA: NEGATIVE
Protein Ur, POC: 100 mg/dL — AB
Spec Grav, UA: 1.015 (ref 1.010–1.025)
Urobilinogen, UA: 1 U/dL
pH, UA: 7 (ref 5.0–8.0)

## 2024-03-05 LAB — POC COVID19/FLU A&B COMBO
Covid Antigen, POC: NEGATIVE
Influenza A Antigen, POC: NEGATIVE
Influenza B Antigen, POC: NEGATIVE

## 2024-03-05 MED ORDER — ACETAMINOPHEN 325 MG PO TABS
ORAL_TABLET | ORAL | Status: AC
  Filled 2024-03-05: qty 2

## 2024-03-05 MED ORDER — ONDANSETRON HCL 4 MG PO TABS: 4.0000 mg | ORAL_TABLET | Freq: Three times a day (TID) | ORAL | 0 refills | Status: AC | PRN

## 2024-03-05 MED ORDER — ACETAMINOPHEN 325 MG PO TABS
650.0000 mg | ORAL_TABLET | Freq: Once | ORAL | Status: AC
Start: 2024-03-05 — End: 2024-03-05
  Administered 2024-03-05: 650 mg via ORAL

## 2024-03-05 NOTE — Discharge Instructions (Addendum)
 Take Zofran  as needed for nausea and vomiting. Take Tylenol  or ibuprofen as needed for fever and bodyaches. If no improvement or symptoms become worse return for evaluation. Make sure you are drinking plenty of fluids.

## 2024-03-05 NOTE — ED Triage Notes (Signed)
 Last night started having. Nausea, headache, fever, leg pain. Reports put heating pad on legs that helped some.  Hasn't taken anything for symptoms.

## 2024-03-05 NOTE — ED Provider Notes (Signed)
 [facility]    CSN: [id number] Arrival date & time: 03/05/24  1753      History   Chief Complaint Chief Complaint  Patient presents with   Fever   Headache    HPI Devin Andrews is a 38 y.o. male.   Patient presents with nausea and vomiting that started last night.  Reports subjective fever at home.  States he does have body aches, legs hurting.  He reports increased urinary frequency, denies CVA tenderness, abdominal pain.  Denies cough, congestion.  Normal bowel movements.    Past Medical History:  Diagnosis Date   Diabetes mellitus without complication (HCC)    Essential hypertension    High cholesterol     Patient Active Problem List   Diagnosis Date Noted   Type 2 diabetes mellitus with obesity (HCC) 10/25/2021   Major depressive disorder, single episode, mild (HCC) 10/25/2021   Tobacco dependence 09/18/2018   Essential hypertension 10/10/2014    Past Surgical History:  Procedure Laterality Date   NO PAST SURGERIES         Home Medications    Prior to Admission medications   Medication Sig Start Date End Date Taking? Authorizing Provider  ondansetron  (ZOFRAN ) 4 MG tablet Take 1 tablet (4 mg total) by mouth every 8 (eight) hours as needed for nausea or vomiting. 03/05/24  Yes Ward, Harlene PEDLAR, PA-C  amLODipine  (NORVASC ) 10 MG tablet Take 1 tablet (10 mg total) by mouth daily. 07/09/23   Danton Jon HERO, PA-C  atorvastatin  (LIPITOR) 20 MG tablet Take 1 tablet (20 mg total) by mouth daily. 07/09/23   McClung, Angela M, PA-C  EPINEPHrine  (EPIPEN  2-PAK) 0.3 mg/0.3 mL IJ SOAJ injection Inject 0.3 mg into the muscle as needed for anaphylaxis. Severe allergic reaction/bee stings Patient not taking: Reported on 05/06/2022 12/06/20   Raspet, Erin K, PA-C  metFORMIN  (GLUCOPHAGE ) 1000 MG tablet Take 1 tablet (1,000 mg total) by mouth daily with breakfast. 07/09/23   Danton Jon HERO, PA-C  sertraline  (ZOLOFT ) 25 MG tablet Take 1 tablet (25 mg total) by  mouth daily. Patient not taking: Reported on 07/09/2023 06/21/22   Vicci Barnie NOVAK, MD  valsartan -hydrochlorothiazide  (DIOVAN -HCT) 320-25 MG tablet Take 1 tablet by mouth daily.Stop single-agent valsartan ! 08/21/23   McClung, Angela M, PA-C  Varenicline  Tartrate, Starter, (CHANTIX  STARTING MONTH PAK) 0.5 MG X 11 & 1 MG X 42 TBPK Use and directed on packet. Patient not taking: Reported on 07/09/2023 05/06/22   Vicci Barnie NOVAK, MD    Family History Family History  Problem Relation Age of Onset   Diabetes Mother    Hypertension Mother     Social History Social History   Tobacco Use   Smoking status: Every Day    Current packs/day: 0.50    Types: Cigarettes   Smokeless tobacco: Former  Substance Use Topics   Alcohol use: Yes    Comment: occ   Drug use: No     Allergies   Bee venom   Review of Systems Review of Systems  Constitutional:  Positive for fever. Negative for chills.  HENT:  Negative for ear pain and sore throat.   Eyes:  Negative for pain and visual disturbance.  Respiratory:  Negative for cough and shortness of breath.   Cardiovascular:  Negative for chest pain and palpitations.  Gastrointestinal:  Positive for nausea and vomiting. Negative for abdominal pain.  Genitourinary:  Negative for dysuria and hematuria.  Musculoskeletal:  Negative for arthralgias and back pain.  Skin:  Negative for color change and rash.  Neurological:  Negative for seizures and syncope.  All other systems reviewed and are negative.    Physical Exam Triage Vital Signs ED Triage Vitals [03/05/24 1815]  Encounter Vitals Group     BP (!) 162/105     Girls Systolic BP Percentile      Girls Diastolic BP Percentile      Boys Systolic BP Percentile      Boys Diastolic BP Percentile      Pulse Rate 99     Resp 20     Temp (!) 100.4 F (38 C)     Temp Source Oral     SpO2 92 %     Weight      Height      Head Circumference      Peak Flow      Pain Score      Pain Loc       Pain Education      Exclude from Growth Chart    No data found.  Updated Vital Signs BP (!) 164/96 (BP Location: Left Arm)   Pulse 94   Temp 98.5 F (36.9 C) (Oral)   Resp 18   SpO2 92%   Visual Acuity Right Eye Distance:   Left Eye Distance:   Bilateral Distance:    Right Eye Near:   Left Eye Near:    Bilateral Near:     Physical Exam Vitals and nursing note reviewed.  Constitutional:      General: He is not in acute distress.    Appearance: He is well-developed.  HENT:     Head: Normocephalic and atraumatic.  Eyes:     Conjunctiva/sclera: Conjunctivae normal.  Cardiovascular:     Rate and Rhythm: Normal rate and regular rhythm.     Heart sounds: No murmur heard. Pulmonary:     Effort: Pulmonary effort is normal. No respiratory distress.     Breath sounds: Normal breath sounds.  Abdominal:     Palpations: Abdomen is soft.     Tenderness: There is no abdominal tenderness.  Musculoskeletal:        General: No swelling.     Cervical back: Neck supple.  Skin:    General: Skin is warm and dry.     Capillary Refill: Capillary refill takes less than 2 seconds.  Neurological:     Mental Status: He is alert.  Psychiatric:        Mood and Affect: Mood normal.      UC Treatments / Results  Labs (all labs ordered are listed, but only abnormal results are displayed) Labs Reviewed  POCT URINALYSIS DIP (MANUAL ENTRY) - Abnormal; Notable for the following components:      Result Value   Color, UA straw (*)    Bilirubin, UA small (*)    Ketones, POC UA moderate (40) (*)    Blood, UA trace-intact (*)    Protein Ur, POC =100 (*)    All other components within normal limits  URINE CULTURE  POC COVID19/FLU A&B COMBO    EKG   Radiology No results found.  Procedures Procedures (including critical care time)  Medications Ordered in UC Medications  acetaminophen  (TYLENOL ) tablet 650 mg (650 mg Oral Given 03/05/24 1820)    Initial Impression / Assessment and  Plan / UC Course  I have reviewed the triage vital signs and the nursing notes.  Pertinent labs & imaging results that were available during my care of  the patient were reviewed by me and considered in my medical decision making (see chart for details).     Ms. likely viral in nature.  Will send in Zofran  for nausea and vomiting.  Advised Tylenol  ibuprofen as needed for fever and bodyaches.  Will send out urine culture and treat if indicated based on results.  No CVA tenderness no abdominal exam.  ED precautions given. Final Clinical Impressions(s) / UC Diagnoses   Final diagnoses:  Fever, unspecified fever cause  Nausea and vomiting, unspecified vomiting type     Discharge Instructions      Take Zofran  as needed for nausea and vomiting. Take Tylenol  or ibuprofen as needed for fever and bodyaches. If no improvement or symptoms become worse return for evaluation. Make sure you are drinking plenty of fluids.   ED Prescriptions     Medication Sig Dispense Auth. Provider   ondansetron  (ZOFRAN ) 4 MG tablet Take 1 tablet (4 mg total) by mouth every 8 (eight) hours as needed for nausea or vomiting. 20 tablet Ward, Khaleb Broz Z, PA-C      PDMP not reviewed this encounter.   Ward, Harlene PEDLAR, PA-C 03/05/24 2011

## 2024-03-06 ENCOUNTER — Emergency Department (HOSPITAL_BASED_OUTPATIENT_CLINIC_OR_DEPARTMENT_OTHER): Admitting: Radiology

## 2024-03-06 ENCOUNTER — Other Ambulatory Visit: Payer: Self-pay

## 2024-03-06 ENCOUNTER — Encounter (HOSPITAL_BASED_OUTPATIENT_CLINIC_OR_DEPARTMENT_OTHER): Payer: Self-pay

## 2024-03-06 ENCOUNTER — Emergency Department (HOSPITAL_BASED_OUTPATIENT_CLINIC_OR_DEPARTMENT_OTHER)
Admission: EM | Admit: 2024-03-06 | Discharge: 2024-03-06 | Disposition: A | Discharge status: Home / Self Care | Attending: Emergency Medicine | Admitting: Emergency Medicine

## 2024-03-06 ENCOUNTER — Telehealth (HOSPITAL_COMMUNITY): Payer: Self-pay

## 2024-03-06 DIAGNOSIS — I10 Essential (primary) hypertension: Secondary | ICD-10-CM | POA: Insufficient documentation

## 2024-03-06 DIAGNOSIS — R519 Headache, unspecified: Secondary | ICD-10-CM | POA: Diagnosis present

## 2024-03-06 DIAGNOSIS — E119 Type 2 diabetes mellitus without complications: Secondary | ICD-10-CM | POA: Insufficient documentation

## 2024-03-06 DIAGNOSIS — R509 Fever, unspecified: Secondary | ICD-10-CM | POA: Diagnosis not present

## 2024-03-06 DIAGNOSIS — Z7984 Long term (current) use of oral hypoglycemic drugs: Secondary | ICD-10-CM | POA: Insufficient documentation

## 2024-03-06 DIAGNOSIS — R350 Frequency of micturition: Secondary | ICD-10-CM | POA: Insufficient documentation

## 2024-03-06 DIAGNOSIS — Z79899 Other long term (current) drug therapy: Secondary | ICD-10-CM | POA: Insufficient documentation

## 2024-03-06 DIAGNOSIS — F17210 Nicotine dependence, cigarettes, uncomplicated: Secondary | ICD-10-CM | POA: Insufficient documentation

## 2024-03-06 DIAGNOSIS — R110 Nausea: Secondary | ICD-10-CM | POA: Diagnosis not present

## 2024-03-06 LAB — URINALYSIS, W/ REFLEX TO CULTURE (INFECTION SUSPECTED)
Bacteria, UA: NONE SEEN
Bilirubin Urine: NEGATIVE
Glucose, UA: NEGATIVE mg/dL
Ketones, ur: NEGATIVE mg/dL
Leukocytes,Ua: NEGATIVE
Nitrite: NEGATIVE
Protein, ur: 100 mg/dL — AB
Specific Gravity, Urine: 1.031 — ABNORMAL HIGH (ref 1.005–1.030)
pH: 6 (ref 5.0–8.0)

## 2024-03-06 LAB — CBC WITH DIFFERENTIAL/PLATELET
Abs Immature Granulocytes: 0.09 K/uL — ABNORMAL HIGH (ref 0.00–0.07)
Basophils Absolute: 0.1 K/uL (ref 0.0–0.1)
Basophils Relative: 0 %
Eosinophils Absolute: 0 K/uL (ref 0.0–0.5)
Eosinophils Relative: 0 %
HCT: 42.4 % (ref 39.0–52.0)
Hemoglobin: 14.1 g/dL (ref 13.0–17.0)
Immature Granulocytes: 1 %
Lymphocytes Relative: 5 %
Lymphs Abs: 0.9 K/uL (ref 0.7–4.0)
MCH: 27.6 pg (ref 26.0–34.0)
MCHC: 33.3 g/dL (ref 30.0–36.0)
MCV: 83.1 fL (ref 80.0–100.0)
Monocytes Absolute: 1 K/uL (ref 0.1–1.0)
Monocytes Relative: 5 %
Neutro Abs: 16.4 K/uL — ABNORMAL HIGH (ref 1.7–7.7)
Neutrophils Relative %: 89 %
Platelets: 150 K/uL (ref 150–400)
RBC: 5.1 MIL/uL (ref 4.22–5.81)
RDW: 15.2 % (ref 11.5–15.5)
WBC: 18.4 K/uL — ABNORMAL HIGH (ref 4.0–10.5)
nRBC: 0 % (ref 0.0–0.2)

## 2024-03-06 LAB — BASIC METABOLIC PANEL WITH GFR
Anion gap: 15 (ref 5–15)
BUN: 13 mg/dL (ref 6–20)
CO2: 22 mmol/L (ref 22–32)
Calcium: 9.1 mg/dL (ref 8.9–10.3)
Chloride: 97 mmol/L — ABNORMAL LOW (ref 98–111)
Creatinine, Ser: 1.33 mg/dL — ABNORMAL HIGH (ref 0.61–1.24)
GFR, Estimated: >60 mL/min (ref >60)
Glucose, Bld: 201 mg/dL — ABNORMAL HIGH (ref 70–99)
Potassium: 3.3 mmol/L — ABNORMAL LOW (ref 3.5–5.1)
Sodium: 133 mmol/L — ABNORMAL LOW (ref 135–145)

## 2024-03-06 LAB — RESP PANEL BY RT-PCR (RSV, FLU A&B, COVID)  RVPGX2
Influenza A by PCR: NEGATIVE
Influenza B by PCR: NEGATIVE
Resp Syncytial Virus by PCR: NEGATIVE
SARS Coronavirus 2 by RT PCR: NEGATIVE

## 2024-03-06 MED ORDER — METOCLOPRAMIDE HCL 5 MG/ML IJ SOLN
10.0000 mg | Freq: Once | INTRAMUSCULAR | Status: AC
  Administered 2024-03-06: 10 mg via INTRAVENOUS
  Filled 2024-03-06: qty 2

## 2024-03-06 MED ORDER — SODIUM CHLORIDE 0.9 % IV BOLUS
1000.0000 mL | Freq: Once | INTRAVENOUS | Status: AC
  Administered 2024-03-06: 1000 mL via INTRAVENOUS

## 2024-03-06 MED ORDER — KETOROLAC TROMETHAMINE 15 MG/ML IJ SOLN
10.0000 mg | Freq: Once | INTRAMUSCULAR | Status: AC
Start: 2024-03-06 — End: 2024-03-06
  Administered 2024-03-06: 10 mg via INTRAVENOUS
  Filled 2024-03-06: qty 1

## 2024-03-06 NOTE — ED Provider Notes (Signed)
 [place] EMERGENCY DEPARTMENT AT [facility] Provider Note  CSN: [id number] Arrival date & time: 03/06/24 1737  Chief Complaint(s) Headache  HPI Devin Andrews is a 38 y.o. male who is here today with nausea, headache, fever.  Reports symptoms have been ongoing for the last few days.  He denies cough or shortness of breath.  Has not had any abdominal pain.  Does report some increased urinary frequency.   Past Medical History Past Medical History:  Diagnosis Date   Diabetes mellitus without complication (HCC)    Essential hypertension    High cholesterol    Patient Active Problem List   Diagnosis Date Noted   Type 2 diabetes mellitus with obesity (HCC) 10/25/2021   Major depressive disorder, single episode, mild (HCC) 10/25/2021   Tobacco dependence 09/18/2018   Essential hypertension 10/10/2014   Home Medication(s) Prior to Admission medications   Medication Sig Start Date End Date Taking? Authorizing Provider  amLODipine  (NORVASC ) 10 MG tablet Take 1 tablet (10 mg total) by mouth daily. 07/09/23   Danton Jon HERO, PA-C  atorvastatin  (LIPITOR) 20 MG tablet Take 1 tablet (20 mg total) by mouth daily. 07/09/23   McClung, Angela M, PA-C  EPINEPHrine  (EPIPEN  2-PAK) 0.3 mg/0.3 mL IJ SOAJ injection Inject 0.3 mg into the muscle as needed for anaphylaxis. Severe allergic reaction/bee stings Patient not taking: Reported on 05/06/2022 12/06/20   Raspet, Erin K, PA-C  metFORMIN  (GLUCOPHAGE ) 1000 MG tablet Take 1 tablet (1,000 mg total) by mouth daily with breakfast. 07/09/23   Danton Jon HERO, PA-C  ondansetron  (ZOFRAN ) 4 MG tablet Take 1 tablet (4 mg total) by mouth every 8 (eight) hours as needed for nausea or vomiting. 03/05/24   Ward, Harlene PEDLAR, PA-C  sertraline  (ZOLOFT ) 25 MG tablet Take 1 tablet (25 mg total) by mouth daily. Patient not taking: Reported on 07/09/2023 06/21/22   Vicci Barnie NOVAK, MD  valsartan -hydrochlorothiazide  (DIOVAN -HCT) 320-25 MG tablet Take 1  tablet by mouth daily.Stop single-agent valsartan ! 08/21/23   McClung, Angela M, PA-C  Varenicline  Tartrate, Starter, (CHANTIX  STARTING MONTH PAK) 0.5 MG X 11 & 1 MG X 42 TBPK Use and directed on packet. Patient not taking: Reported on 07/09/2023 05/06/22   Vicci Barnie NOVAK, MD                                                                                                                                    Past Surgical History Past Surgical History:  Procedure Laterality Date   NO PAST SURGERIES     Family History Family History  Problem Relation Age of Onset   Diabetes Mother    Hypertension Mother     Social History Social History   Tobacco Use   Smoking status: Every Day    Current packs/day: 0.50    Types: Cigarettes   Smokeless tobacco: Former  Substance Use Topics   Alcohol use: Yes    Comment: occ  Drug use: No   Allergies Bee venom  Review of Systems Review of Systems  Physical Exam Vital Signs  I have reviewed the triage vital signs BP (!) 150/106   Pulse 76   Temp 99.1 F (37.3 C)   Resp 19   Ht 6' 2 (1.88 m)   Wt (!) 140.6 kg   SpO2 97%   BMI 39.80 kg/m   Physical Exam Vitals reviewed.  HENT:     Head: Normocephalic and atraumatic.  Neck:     Meningeal: Brudzinski's sign and Kernig's sign absent.  Cardiovascular:     Rate and Rhythm: Normal rate.     Heart sounds: Normal heart sounds.  Pulmonary:     Effort: Pulmonary effort is normal.     Breath sounds: Normal breath sounds.  Musculoskeletal:        General: Normal range of motion.     Cervical back: Normal range of motion and neck supple.  Skin:    General: Skin is warm and dry.  Neurological:     Mental Status: He is alert.     Cranial Nerves: No cranial nerve deficit.     Sensory: No sensory deficit.     Gait: Gait normal.     ED Results and Treatments Labs (all labs ordered are listed, but only abnormal results are displayed) Labs Reviewed  CBC WITH DIFFERENTIAL/PLATELET  - Abnormal; Notable for the following components:      Result Value   WBC 18.4 (*)    Neutro Abs 16.4 (*)    Abs Immature Granulocytes 0.09 (*)    All other components within normal limits  BASIC METABOLIC PANEL WITH GFR - Abnormal; Notable for the following components:   Sodium 133 (*)    Potassium 3.3 (*)    Chloride 97 (*)    Glucose, Bld 201 (*)    Creatinine, Ser 1.33 (*)    All other components within normal limits  URINALYSIS, W/ REFLEX TO CULTURE (INFECTION SUSPECTED) - Abnormal; Notable for the following components:   Specific Gravity, Urine 1.031 (*)    Hgb urine dipstick MODERATE (*)    Protein, ur 100 (*)    All other components within normal limits  RESP PANEL BY RT-PCR (RSV, FLU A&B, COVID)  RVPGX2                                                                                                                          Radiology DG Chest 1 View Result Date: 03/06/2024 CLINICAL DATA:  cough EXAM: CHEST  1 VIEW COMPARISON:  June 18, 2023 FINDINGS: No lobar consolidation, pleural effusion, or pneumothorax. Mild cardiomegaly. Asymmetric prominence of the right infrahilar region. No acute fracture or destructive lesion. IMPRESSION: Asymmetric prominence of the right infrahilar region. While this may be artifactual due to technique, it could also reflect asymmetric dilation of the pulmonary artery, lymphadenopathy, or perihilar airspace disease. Nonemergent chest CT with IV contrast should be considered for further characterization.  Electronically Signed   By: Rogelia Myers M.D.   On: 03/06/2024 18:19    Pertinent labs & imaging results that were available during my care of the patient were reviewed by me and considered in my medical decision making (see MDM for details).  Medications Ordered in ED Medications  metoCLOPramide  (REGLAN ) injection 10 mg (10 mg Intravenous Given 03/06/24 1815)  ketorolac  (TORADOL ) 15 MG/ML injection 10 mg (10 mg Intravenous Given 03/06/24 2013)   sodium chloride  0.9 % bolus 1,000 mL (1,000 mLs Intravenous New Bag/Given 03/06/24 2013)                                                                                                                                     Procedures Procedures  (including critical care time)  Medical Decision Making / ED Course   This patient presents to the ED for concern of fever, headache, this involves an extensive number of treatment options, and is a complaint that carries with it a high risk of complications and morbidity.  The differential diagnosis includes viral syndrome, consider meningitis, encephalitis, headache.  MDM: Patient states his pain initially started in his leg, however now he feels though it is head.  He has had fever.  Patient awake, alert oriented.  He is afebrile here, took Motrin a few hours ago, did receive Tylenol  via EMS.  He is nontachycardic.  Think is less likely that this patient has meningitis, however will check some labs on the patient, viral swab.  No history of trauma, with fever, unlikely to be ICH.  No neurological deficits.  Reassessment 8:40 PM-patient with leukocytosis, left shift.  No infectious source is found in the patient's urine, chest x-ray, viral swab negative.  I had a lengthy discussion with the patient and his partner at bedside.  We discussed whether or not patient required an LP.  I explained the patient how there was concern that he could potentially have bacterial or viral meningitis.  I explained how the only way we could definitively determine whether or not he did have 1 of these conditions would be to perform LP.  We discussed risks of this procedure which included infection, pain, post procedural headache.  Patient has been ambulating in the emergency room, when I had this discussion with the patient, he was laying on his side in the bed with his head propped up with his hand.  I do think it is less likely that he has a bacterial meningitis, however  could certainly have a viral meningitis.  After this lengthy discussion regarding a lumbar puncture, patient preferred not to proceed with LP.  I discussed strict return precautions with the patient at bedside.  He was agreeable with this plan.  His headache is significantly improved.  Will discharge.  Additional history obtained: -Additional history obtained from partner at bedside -External records from outside source obtained and reviewed including: Chart review including previous notes,  labs, imaging, consultation notes   Lab Tests: -I ordered, reviewed, and interpreted labs.   The pertinent results include:   Labs Reviewed  CBC WITH DIFFERENTIAL/PLATELET - Abnormal; Notable for the following components:      Result Value   WBC 18.4 (*)    Neutro Abs 16.4 (*)    Abs Immature Granulocytes 0.09 (*)    All other components within normal limits  BASIC METABOLIC PANEL WITH GFR - Abnormal; Notable for the following components:   Sodium 133 (*)    Potassium 3.3 (*)    Chloride 97 (*)    Glucose, Bld 201 (*)    Creatinine, Ser 1.33 (*)    All other components within normal limits  URINALYSIS, W/ REFLEX TO CULTURE (INFECTION SUSPECTED) - Abnormal; Notable for the following components:   Specific Gravity, Urine 1.031 (*)    Hgb urine dipstick MODERATE (*)    Protein, ur 100 (*)    All other components within normal limits  RESP PANEL BY RT-PCR (RSV, FLU A&B, COVID)  RVPGX2        Imaging Studies ordered: I ordered imaging studies including chest x-ray I independently visualized and interpreted imaging. I agree with the radiologist interpretation   Medicines ordered and prescription drug management: Meds ordered this encounter  Medications   metoCLOPramide  (REGLAN ) injection 10 mg   ketorolac  (TORADOL ) 15 MG/ML injection 10 mg   sodium chloride  0.9 % bolus 1,000 mL    -I have reviewed the patients home medicines and have made adjustments as needed   Cardiac  Monitoring: The patient was maintained on a cardiac monitor.  I personally viewed and interpreted the cardiac monitored which showed an underlying rhythm of: Normal sinus rhythm  Social Determinants of Health:  Factors impacting patients care include: Lack of access to primary care   Reevaluation: After the interventions noted above, I reevaluated the patient and found that they have :improved  Co morbidities that complicate the patient evaluation  Past Medical History:  Diagnosis Date   Diabetes mellitus without complication (HCC)    Essential hypertension    High cholesterol       Dispostion: I considered admission for this patient, however after lengthy discussion with the patient he prefers close follow-up.     Final Clinical Impression(s) / ED Diagnoses Final diagnoses:  Nonintractable headache, unspecified chronicity pattern, unspecified headache type     @PCDICTATION @    Mannie Pac T, DO 03/06/24 2048

## 2024-03-06 NOTE — Telephone Encounter (Signed)
 Received call from patient. Reports he is not feeling any better and has a headache. I have advised patient to come back in or follow-up at the ED if he has follow all instruction provided by the provider yesterday.  Patient ask about his urine results and advised him the culture could take up to 2-3 days to come back.

## 2024-03-06 NOTE — Discharge Instructions (Signed)
 While you are in the emergency room, you have blood work done which did show an elevation in your white blood cell count.  Like we discussed, the concern is a potential of something like a bacterial or a viral meningitis.  While I think this is less likely, if you develop worsening fever, headache, confusion, vomiting, trouble with your vision, or feel as though you are getting worse, it is very important you return to the emergency room immediately.  If this is a simple viral headache, your symptoms should begin to improve over the next 24 to 48 hours.  You may take Motrin and Tylenol .

## 2024-03-06 NOTE — ED Triage Notes (Signed)
 Bib by [facility] c/o nausea, headache, and fever. Seen at UC last night for same.

## 2024-03-07 ENCOUNTER — Emergency Department (HOSPITAL_BASED_OUTPATIENT_CLINIC_OR_DEPARTMENT_OTHER)
Admission: EM | Admit: 2024-03-07 | Discharge: 2024-03-07 | Disposition: A | Discharge status: Home / Self Care | Attending: Emergency Medicine | Admitting: Emergency Medicine

## 2024-03-07 ENCOUNTER — Encounter (HOSPITAL_BASED_OUTPATIENT_CLINIC_OR_DEPARTMENT_OTHER): Payer: Self-pay

## 2024-03-07 ENCOUNTER — Emergency Department (HOSPITAL_BASED_OUTPATIENT_CLINIC_OR_DEPARTMENT_OTHER)

## 2024-03-07 ENCOUNTER — Other Ambulatory Visit: Payer: Self-pay

## 2024-03-07 DIAGNOSIS — E871 Hypo-osmolality and hyponatremia: Secondary | ICD-10-CM | POA: Diagnosis not present

## 2024-03-07 DIAGNOSIS — D72829 Elevated white blood cell count, unspecified: Secondary | ICD-10-CM | POA: Insufficient documentation

## 2024-03-07 DIAGNOSIS — J168 Pneumonia due to other specified infectious organisms: Secondary | ICD-10-CM | POA: Diagnosis not present

## 2024-03-07 DIAGNOSIS — R519 Headache, unspecified: Secondary | ICD-10-CM | POA: Diagnosis present

## 2024-03-07 DIAGNOSIS — J189 Pneumonia, unspecified organism: Secondary | ICD-10-CM

## 2024-03-07 LAB — URINALYSIS, ROUTINE W REFLEX MICROSCOPIC
Bacteria, UA: NONE SEEN
Bilirubin Urine: NEGATIVE
Glucose, UA: NEGATIVE mg/dL
Ketones, ur: 15 mg/dL — AB
Leukocytes,Ua: NEGATIVE
Nitrite: NEGATIVE
Protein, ur: 100 mg/dL — AB
Specific Gravity, Urine: 1.043 — ABNORMAL HIGH (ref 1.005–1.030)
pH: 6.5 (ref 5.0–8.0)

## 2024-03-07 LAB — COMPREHENSIVE METABOLIC PANEL WITH GFR
ALT: 27 U/L (ref 0–44)
AST: 36 U/L (ref 15–41)
Albumin: 3.9 g/dL (ref 3.5–5.0)
Alkaline Phosphatase: 96 U/L (ref 38–126)
Anion gap: 14 (ref 5–15)
BUN: 14 mg/dL (ref 6–20)
CO2: 23 mmol/L (ref 22–32)
Calcium: 9.5 mg/dL (ref 8.9–10.3)
Chloride: 96 mmol/L — ABNORMAL LOW (ref 98–111)
Creatinine, Ser: 1.22 mg/dL (ref 0.61–1.24)
GFR, Estimated: >60 mL/min (ref >60)
Glucose, Bld: 162 mg/dL — ABNORMAL HIGH (ref 70–99)
Potassium: 3.5 mmol/L (ref 3.5–5.1)
Sodium: 134 mmol/L — ABNORMAL LOW (ref 135–145)
Total Bilirubin: 0.5 mg/dL (ref 0.0–1.2)
Total Protein: 7.3 g/dL (ref 6.5–8.1)

## 2024-03-07 LAB — CBC WITH DIFFERENTIAL/PLATELET
Abs Immature Granulocytes: 0.07 K/uL (ref 0.00–0.07)
Basophils Absolute: 0 K/uL (ref 0.0–0.1)
Basophils Relative: 0 %
Eosinophils Absolute: 0 K/uL (ref 0.0–0.5)
Eosinophils Relative: 0 %
HCT: 40 % (ref 39.0–52.0)
Hemoglobin: 13.1 g/dL (ref 13.0–17.0)
Immature Granulocytes: 1 %
Lymphocytes Relative: 5 %
Lymphs Abs: 0.7 K/uL (ref 0.7–4.0)
MCH: 27 pg (ref 26.0–34.0)
MCHC: 32.8 g/dL (ref 30.0–36.0)
MCV: 82.5 fL (ref 80.0–100.0)
Monocytes Absolute: 1.3 K/uL — ABNORMAL HIGH (ref 0.1–1.0)
Monocytes Relative: 11 %
Neutro Abs: 10.1 K/uL — ABNORMAL HIGH (ref 1.7–7.7)
Neutrophils Relative %: 83 %
Platelets: 149 K/uL — ABNORMAL LOW (ref 150–400)
RBC: 4.85 MIL/uL (ref 4.22–5.81)
RDW: 15 % (ref 11.5–15.5)
WBC: 12.2 K/uL — ABNORMAL HIGH (ref 4.0–10.5)
nRBC: 0 % (ref 0.0–0.2)

## 2024-03-07 LAB — URINE CULTURE: Culture: <10000 — AB

## 2024-03-07 LAB — LACTIC ACID, PLASMA: Lactic Acid, Venous: 0.9 mmol/L (ref 0.5–1.9)

## 2024-03-07 LAB — LIPASE, BLOOD: Lipase: 18 U/L (ref 11–51)

## 2024-03-07 MED ORDER — AMOXICILLIN-POT CLAVULANATE 875-125 MG PO TABS: 1.0000 | ORAL_TABLET | Freq: Two times a day (BID) | ORAL | 0 refills | Status: AC

## 2024-03-07 MED ORDER — METOCLOPRAMIDE HCL 5 MG/ML IJ SOLN
10.0000 mg | Freq: Once | INTRAMUSCULAR | Status: AC
  Administered 2024-03-07: 10 mg via INTRAVENOUS
  Filled 2024-03-07: qty 2

## 2024-03-07 MED ORDER — AZITHROMYCIN 250 MG PO TABS: 250.0000 mg | ORAL_TABLET | Freq: Every day | ORAL | 0 refills | Status: AC

## 2024-03-07 MED ORDER — KETOROLAC TROMETHAMINE 15 MG/ML IJ SOLN
30.0000 mg | Freq: Once | INTRAMUSCULAR | Status: AC
  Administered 2024-03-07: 30 mg via INTRAVENOUS
  Filled 2024-03-07: qty 2

## 2024-03-07 MED ORDER — MAGNESIUM SULFATE IN D5W 1-5 GM/100ML-% IV SOLN
1.0000 g | Freq: Once | INTRAVENOUS | Status: AC
  Administered 2024-03-07: 1 g via INTRAVENOUS
  Filled 2024-03-07: qty 100

## 2024-03-07 MED ORDER — AZITHROMYCIN 250 MG PO TABS
500.0000 mg | ORAL_TABLET | Freq: Once | ORAL | Status: AC
Start: 2024-03-07 — End: 2024-03-07
  Administered 2024-03-07: 500 mg via ORAL
  Filled 2024-03-07: qty 2

## 2024-03-07 MED ORDER — DIPHENHYDRAMINE HCL 50 MG/ML IJ SOLN
25.0000 mg | Freq: Once | INTRAMUSCULAR | Status: AC
  Administered 2024-03-07: 25 mg via INTRAVENOUS
  Filled 2024-03-07: qty 1

## 2024-03-07 MED ORDER — AMOXICILLIN-POT CLAVULANATE 875-125 MG PO TABS
1.0000 | ORAL_TABLET | Freq: Once | ORAL | Status: AC
  Administered 2024-03-07: 1 via ORAL
  Filled 2024-03-07: qty 1

## 2024-03-07 MED ORDER — ACETAMINOPHEN 325 MG PO TABS
650.0000 mg | ORAL_TABLET | Freq: Once | ORAL | Status: AC
  Administered 2024-03-07: 650 mg via ORAL
  Filled 2024-03-07: qty 2

## 2024-03-07 MED ORDER — IOHEXOL 300 MG/ML  SOLN
100.0000 mL | Freq: Once | INTRAMUSCULAR | Status: AC | PRN
  Administered 2024-03-07: 100 mL via INTRAVENOUS

## 2024-03-07 NOTE — Discharge Instructions (Addendum)
 Today you were seen for a headache.  You were found to have pneumonia which was likely causing this headache.  Please pick up your antibiotic and take as prescribed.  Please return to the ED if you have uncontrollable vomiting, fever that does not go down with Tylenol  or Motrin, or worsening shortness of breath.  Thank you for letting us  treat you today. After reviewing your labs and imaging, I feel you are safe to go home. Please follow up with your PCP in the next several days and provide them with your records from this visit. Return to the Emergency Room if pain becomes severe or symptoms worsen.

## 2024-03-07 NOTE — ED Notes (Signed)
 Pt returns here tonight with HA, was seen here last night and Dc'd home Pt did not want to have LP done, no hx of migraines

## 2024-03-07 NOTE — ED Notes (Signed)
 Patient transported to CT

## 2024-03-07 NOTE — ED Provider Notes (Signed)
 [place] EMERGENCY DEPARTMENT AT [facility] Provider Note   CSN: [id number] Arrival date & time: 03/07/24  1948     Patient presents with: Headache   Devin Andrews is a 38 y.o. male presents today for headache times multiple days.  Patient was seen last night for same.  Patient also reporting nausea and fever.  Patient denies cough, congestion, shortness of breath, abdominal pain, vomiting, diarrhea, dysuria, or neck stiffness.    Headache      Prior to Admission medications   Medication Sig Start Date End Date Taking? Authorizing Provider  amoxicillin -clavulanate (AUGMENTIN ) 875-125 MG tablet Take 1 tablet by mouth every 12 (twelve) hours. 03/07/24  Yes Kyleen Villatoro N, PA-C  azithromycin  (ZITHROMAX  Z-PAK) 250 MG tablet Take 1 tablet (250 mg total) by mouth daily. 03/07/24  Yes Sopheap Boehle N, PA-C  amLODipine  (NORVASC ) 10 MG tablet Take 1 tablet (10 mg total) by mouth daily. 07/09/23   Danton Jon HERO, PA-C  atorvastatin  (LIPITOR) 20 MG tablet Take 1 tablet (20 mg total) by mouth daily. 07/09/23   McClung, Angela M, PA-C  EPINEPHrine  (EPIPEN  2-PAK) 0.3 mg/0.3 mL IJ SOAJ injection Inject 0.3 mg into the muscle as needed for anaphylaxis. Severe allergic reaction/bee stings Patient not taking: Reported on 05/06/2022 12/06/20   Raspet, Erin K, PA-C  metFORMIN  (GLUCOPHAGE ) 1000 MG tablet Take 1 tablet (1,000 mg total) by mouth daily with breakfast. 07/09/23   Danton Jon HERO, PA-C  ondansetron  (ZOFRAN ) 4 MG tablet Take 1 tablet (4 mg total) by mouth every 8 (eight) hours as needed for nausea or vomiting. 03/05/24   Ward, Harlene PEDLAR, PA-C  sertraline  (ZOLOFT ) 25 MG tablet Take 1 tablet (25 mg total) by mouth daily. Patient not taking: Reported on 07/09/2023 06/21/22   Vicci Barnie NOVAK, MD  valsartan -hydrochlorothiazide  (DIOVAN -HCT) 320-25 MG tablet Take 1 tablet by mouth daily.Stop single-agent valsartan ! 08/21/23   McClung, Angela M, PA-C  Varenicline  Tartrate, Starter,  (CHANTIX  STARTING MONTH PAK) 0.5 MG X 11 & 1 MG X 42 TBPK Use and directed on packet. Patient not taking: Reported on 07/09/2023 05/06/22   Vicci Barnie NOVAK, MD    Allergies: Bee venom    Review of Systems  Neurological:  Positive for headaches.    Updated Vital Signs BP (!) 146/103   Pulse 74   Temp 100.1 F (37.8 C) (Oral)   Resp 18   SpO2 100%   Physical Exam  (all labs ordered are listed, but only abnormal results are displayed) Labs Reviewed  COMPREHENSIVE METABOLIC PANEL WITH GFR - Abnormal; Notable for the following components:      Result Value   Sodium 134 (*)    Chloride 96 (*)    Glucose, Bld 162 (*)    All other components within normal limits  CBC WITH DIFFERENTIAL/PLATELET - Abnormal; Notable for the following components:   WBC 12.2 (*)    Platelets 149 (*)    Neutro Abs 10.1 (*)    Monocytes Absolute 1.3 (*)    All other components within normal limits  URINALYSIS, ROUTINE W REFLEX MICROSCOPIC - Abnormal; Notable for the following components:   Specific Gravity, Urine 1.043 (*)    Hgb urine dipstick MODERATE (*)    Ketones, ur 15 (*)    Protein, ur 100 (*)    All other components within normal limits  LIPASE, BLOOD  LACTIC ACID, PLASMA  LACTIC ACID, PLASMA    EKG: None  Radiology: CT Head Wo Contrast Result Date: 03/07/2024 CLINICAL  DATA:  Headaches EXAM: CT HEAD WITHOUT CONTRAST TECHNIQUE: Contiguous axial images were obtained from the base of the skull through the vertex without intravenous contrast. RADIATION DOSE REDUCTION: This exam was performed according to the departmental dose-optimization program which includes automated exposure control, adjustment of the mA and/or kV according to patient size and/or use of iterative reconstruction technique. COMPARISON:  08/16/2021 FINDINGS: Brain: No evidence of acute infarction, hemorrhage, hydrocephalus, extra-axial collection or mass lesion/mass effect. Vascular: No hyperdense vessel or unexpected  calcification. Skull: Normal. Negative for fracture or focal lesion. Sinuses/Orbits: No acute finding. Other: None. IMPRESSION: No acute intracranial abnormality noted. Electronically Signed   By: Oneil Devonshire M.D.   On: 03/07/2024 22:49   CT Chest W Contrast Result Date: 03/07/2024 CLINICAL DATA:  Chest wall mass. EXAM: CT CHEST WITH CONTRAST TECHNIQUE: Multidetector CT imaging of the chest was performed during intravenous contrast administration. RADIATION DOSE REDUCTION: This exam was performed according to the departmental dose-optimization program which includes automated exposure control, adjustment of the mA and/or kV according to patient size and/or use of iterative reconstruction technique. CONTRAST:  OMNIPAQUE  IOHEXOL  300 MG/ML  SOLN COMPARISON:  Chest x-ray 03/06/2024 FINDINGS: Cardiovascular: Heart is borderline enlarged. There is no pericardial effusion. Aorta is normal in size. Mediastinum/Nodes: Visualized thyroid gland is within normal limits. Nonenlarged and enlarged right hilar lymph nodes are present measuring up to 15 mm. No other enlarged lymph nodes are seen. Visualized esophagus is within normal limits. Lungs/Pleura: There is diffuse right lower lobe airspace consolidation with air bronchograms more prominent in the superior segment. There are also ground-glass opacities in the central right upper lobe at the level of the superior segment of the right lower lobe. There is some secretions in the distal right mainstem bronchus. Left lung is clear. No pleural effusion or pneumothorax. Upper Abdomen: No acute abnormality. Musculoskeletal: No fracture is seen. IMPRESSION: 1. Right lower lobe airspace consolidation worrisome for pneumonia. Follow-up imaging recommended to confirm resolution. 2. Right hilar lymphadenopathy, likely reactive. 3. Borderline cardiomegaly. Electronically Signed   By: Greig Pique M.D.   On: 03/07/2024 22:48   DG Chest 1 View Result Date: 03/06/2024 CLINICAL  DATA:  cough EXAM: CHEST  1 VIEW COMPARISON:  June 18, 2023 FINDINGS: No lobar consolidation, pleural effusion, or pneumothorax. Mild cardiomegaly. Asymmetric prominence of the right infrahilar region. No acute fracture or destructive lesion. IMPRESSION: Asymmetric prominence of the right infrahilar region. While this may be artifactual due to technique, it could also reflect asymmetric dilation of the pulmonary artery, lymphadenopathy, or perihilar airspace disease. Nonemergent chest CT with IV contrast should be considered for further characterization. Electronically Signed   By: Rogelia Myers M.D.   On: 03/06/2024 18:19     Procedures   Medications Ordered in the ED  magnesium  sulfate IVPB 1 g 100 mL (1 g Intravenous New Bag/Given 03/07/24 2238)  acetaminophen  (TYLENOL ) tablet 650 mg (650 mg Oral Given 03/07/24 2132)  metoCLOPramide  (REGLAN ) injection 10 mg (10 mg Intravenous Given 03/07/24 2216)  diphenhydrAMINE  (BENADRYL ) injection 25 mg (25 mg Intravenous Given 03/07/24 2213)  iohexol  (OMNIPAQUE ) 300 MG/ML solution 100 mL (100 mLs Intravenous Contrast Given 03/07/24 2221)  amoxicillin -clavulanate (AUGMENTIN ) 875-125 MG per tablet 1 tablet (1 tablet Oral Given 03/07/24 2317)  azithromycin  (ZITHROMAX ) tablet 500 mg (500 mg Oral Given 03/07/24 2316)  ketorolac  (TORADOL ) 15 MG/ML injection 30 mg (30 mg Intravenous Given 03/07/24 2317)  Medical Decision Making Amount and/or Complexity of Data Reviewed Labs: ordered. Radiology: ordered.  Risk OTC drugs. Prescription drug management.   This patient presents to the ED for concern of headache with nausea and fever differential diagnosis includes viral meningitis, headache, viral illness   Lab Tests:  I Ordered, and personally interpreted labs.  The pertinent results include: CBC with leukocytosis at 12.2, mild hyponatremia at 134, mildly reduced chloride at 96, lactic acid 0.9, UA with moderate  hemoglobin, 15 ketones, 100 protein, and elevated specific gravity   Imaging Studies ordered:  I ordered imaging studies including CT chest with contrast and CT head Noncon I independently visualized and interpreted imaging which showed right lower lobe airspace consolidation worrisome for pneumonia.  Right hilar lymphadenopathy reactive.  No acute intracranial abnormality I agree with the radiologist interpretation   Medicines ordered and prescription drug management:  I ordered medication including migraine cocktail, azithromycin , Augmentin , toradol     I have reviewed the patients home medicines and have made adjustments as needed   Problem List / ED Course:  Considered for admission or further workup however patient's vital signs, physical exam, labs, and imaging are reassuring.  Patient's symptoms likely due to pneumonia.  Patient given outpatient antibiotics.  Patient given return precautions.  I feel patient safe for discharge at this time.     Final diagnoses:  Pneumonia due to infectious organism, unspecified laterality, unspecified part of lung  Nonintractable headache, unspecified chronicity pattern, unspecified headache type    ED Discharge Orders          Ordered    amoxicillin -clavulanate (AUGMENTIN ) 875-125 MG tablet  Every 12 hours        03/07/24 2326    azithromycin  (ZITHROMAX  Z-PAK) 250 MG tablet  Daily        03/07/24 2326               Francis Ileana SAILOR, PA-C 03/07/24 2326    Zackowski, Scott, MD 03/08/24 [phone]

## 2024-03-07 NOTE — ED Triage Notes (Signed)
 Pt preents via POV c/o headache since last night. Reports seen last night for the same.

## 2024-03-08 ENCOUNTER — Ambulatory Visit (HOSPITAL_COMMUNITY): Payer: Self-pay

## 2024-03-09 ENCOUNTER — Encounter: Payer: Self-pay | Admitting: Internal Medicine

## 2024-03-10 ENCOUNTER — Other Ambulatory Visit: Payer: Self-pay

## 2024-04-01 ENCOUNTER — Ambulatory Visit: Admitting: Internal Medicine

## 2024-04-20 ENCOUNTER — Other Ambulatory Visit: Payer: Self-pay

## 2024-04-22 ENCOUNTER — Ambulatory Visit: Admitting: Physician Assistant

## 2024-05-11 ENCOUNTER — Encounter: Payer: Self-pay | Admitting: Internal Medicine

## 2024-05-11 ENCOUNTER — Ambulatory Visit: Attending: Internal Medicine | Admitting: Internal Medicine

## 2024-05-11 ENCOUNTER — Other Ambulatory Visit: Payer: Self-pay

## 2024-05-11 VITALS — BP 151/103 | HR 70 | Temp 98.1°F | Ht 74.0 in | Wt 301.0 lb

## 2024-05-11 DIAGNOSIS — F17210 Nicotine dependence, cigarettes, uncomplicated: Secondary | ICD-10-CM

## 2024-05-11 DIAGNOSIS — E119 Type 2 diabetes mellitus without complications: Secondary | ICD-10-CM

## 2024-05-11 DIAGNOSIS — F17200 Nicotine dependence, unspecified, uncomplicated: Secondary | ICD-10-CM

## 2024-05-11 DIAGNOSIS — E1159 Type 2 diabetes mellitus with other circulatory complications: Secondary | ICD-10-CM

## 2024-05-11 DIAGNOSIS — R918 Other nonspecific abnormal finding of lung field: Secondary | ICD-10-CM

## 2024-05-11 DIAGNOSIS — B356 Tinea cruris: Secondary | ICD-10-CM

## 2024-05-11 DIAGNOSIS — E1169 Type 2 diabetes mellitus with other specified complication: Secondary | ICD-10-CM | POA: Diagnosis not present

## 2024-05-11 DIAGNOSIS — I10 Essential (primary) hypertension: Secondary | ICD-10-CM

## 2024-05-11 DIAGNOSIS — E669 Obesity, unspecified: Secondary | ICD-10-CM

## 2024-05-11 DIAGNOSIS — E785 Hyperlipidemia, unspecified: Secondary | ICD-10-CM

## 2024-05-11 DIAGNOSIS — Z7984 Long term (current) use of oral hypoglycemic drugs: Secondary | ICD-10-CM

## 2024-05-11 DIAGNOSIS — Z2821 Immunization not carried out because of patient refusal: Secondary | ICD-10-CM

## 2024-05-11 DIAGNOSIS — F331 Major depressive disorder, recurrent, moderate: Secondary | ICD-10-CM

## 2024-05-11 LAB — GLUCOSE, POCT (MANUAL RESULT ENTRY): POC Glucose: 115 mg/dL — AB (ref 70–99)

## 2024-05-11 LAB — POCT GLYCOSYLATED HEMOGLOBIN (HGB A1C): HbA1c, POC (controlled diabetic range): 8.1 % — AB (ref 0.0–7.0)

## 2024-05-11 MED ORDER — METFORMIN HCL 1000 MG PO TABS
1000.0000 mg | ORAL_TABLET | Freq: Two times a day (BID) | ORAL | 1 refills | Status: DC
  Filled 2024-05-11: qty 180, 90d supply, fill #0

## 2024-05-11 MED ORDER — AMLODIPINE BESYLATE 10 MG PO TABS
10.0000 mg | ORAL_TABLET | Freq: Every day | ORAL | 1 refills | Status: DC
  Filled 2024-05-11 (×2): qty 90, 90d supply, fill #0

## 2024-05-11 MED ORDER — ATORVASTATIN CALCIUM 20 MG PO TABS
20.0000 mg | ORAL_TABLET | Freq: Every day | ORAL | 1 refills | Status: DC
  Filled 2024-05-11 (×2): qty 90, 90d supply, fill #0

## 2024-05-11 MED ORDER — NYSTATIN-TRIAMCINOLONE 100000-0.1 UNIT/GM-% EX OINT
1.0000 | TOPICAL_OINTMENT | Freq: Two times a day (BID) | CUTANEOUS | 0 refills | Status: DC
  Filled 2024-05-11: qty 30, 30d supply, fill #0

## 2024-05-11 MED ORDER — BUPROPION HCL ER (XL) 150 MG PO TB24
150.0000 mg | ORAL_TABLET | Freq: Every day | ORAL | 0 refills | Status: DC
  Filled 2024-05-11: qty 60, 60d supply, fill #0

## 2024-05-11 MED ORDER — VALSARTAN-HYDROCHLOROTHIAZIDE 320-25 MG PO TABS
1.0000 | ORAL_TABLET | Freq: Every day | ORAL | 1 refills | Status: DC
  Filled 2024-05-11: qty 90, 90d supply, fill #0

## 2024-05-11 MED ORDER — SPIRONOLACTONE 25 MG PO TABS
12.5000 mg | ORAL_TABLET | Freq: Every day | ORAL | 1 refills | Status: DC
  Filled 2024-05-11: qty 45, 90d supply, fill #0

## 2024-05-11 NOTE — Progress Notes (Signed)
 Patient ID: Devin Andrews, male    DOB: 09/24/1985  MRN: [id number]  CC: Diabetes (DM f/u./Reports high BP readings /Discuss pneumonia & flu vax )   Subjective: Devin Andrews is a 38 y.o. male who presents for chronic ds management. His concerns today include:  PMH is significant for HTN, DM, tob dep, obesity, allergy to bee sting, MDD  The patient presented to the ED with headache, nausea, and fever on 03/07/2024. Labs were significant for leukocytosis, mild hyponatremia (134), and mild hypochloremia (96). Urinalysis showed moderate hemoglobin, ketones, protein, and elevated specific gravity. CT head was negative for acute intracranial abnormality, while CT chest revealed right lower lobe consolidation consistent with pneumonia, borderline cardiomegaly, and right hilar lymphadenopathy. The patient was treated with a migraine cocktail for headache and azithromycin  and Augmentin  for pneumonia. He was discharged home with outpatient antibiotics. Radiology recommended future f/u CT of chest to confirm resolution.  Today he reports his sx have fully resolved. Does endorse a cough for the last 3 days productive of cream sputum, without fevers or chills.  May have SOB at night only.  Dyspnea- Also reports that for 5-6 months, 2x a week he will wake up from sleep 2/2 dyspnea. Reports snoring is mild. Denies apneic events, acid reflux, or indigestion.  Itching of groin- Reports his wife had a yeast injection and he has developed itching and a rash in his groin area.   T2DM- A1C today is 8.1 (previously 7.1 in June). POCT glucose is 115. He takes metformin  1000 mg daily and atorvastatin  20 mg daily, which he tolerates well. Weight is 301 lb, BMI 38.65. He reports eating healthier with baked meats, wheat breads. Is trying to eat more vegetables and consume daily fruits. He stopped eating bananas because he heard they were bad for him. He is motivated by his brother's history of heart failure  to improve his diet. Exercises on the treadmill for 30 minutes every other day and walks frequently at work. Has an upcoming diabetic eye exam scheduled at [facility] next week. Results for orders placed or performed in visit on 05/11/24  POCT glucose (manual entry)   Collection Time: 05/11/24  3:37 PM  Result Value Ref Range   POC Glucose 115 (A) 70 - 99 mg/dl  POCT glycosylated hemoglobin (Hb A1C)   Collection Time: 05/11/24  3:37 PM  Result Value Ref Range   Hemoglobin A1C     HbA1c POC (<> result, manual entry)     HbA1c, POC (prediabetic range)     HbA1c, POC (controlled diabetic range) 8.1 (A) 0.0 - 7.0 %   HTN: BP 165/110 initial; 151/103 on recheck. Saw our clinical pharamcist in January who switched his Diovan  to Diovan -HCTZ. Is taking Diovan -HCTZ 320-25 mg daily and amlodipine  10 mg daily every morning. States he stopped taking his BP pills for a while after having pneumonia on 03/07/24, but restarted them again since late August and reports good compliance. BP is 150-170s systolic at home. Is limiting salt in foods. Denies CP, leg swelling, HA, or dizziness.  Tob dep: 0.5 PPD currently, previously cut back from 1ppd. Smoking since high school. Indicates desire to quit but finds it too difficult. Tried Chantix , nicotine  patches, and nicotine  gum in the past but these did not help with his cravings.   MDD- Ran out of Zoloft  25 mg daily for depression but would like to restart it. Feels more depressed lately. Denies SI.  HM Declined all vaccines. Due for Tdap,  Pneumococcal Vaccine, Hepatitis B Vaccine, and Influenza Vaccine.   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 Severe allergic reaction/bee stings 1 each 0   ondansetron  (ZOFRAN ) 4 MG tablet Take 1 tablet (4 mg total) by mouth every 8 (eight) hours as needed for nausea or vomiting. 20 tablet 0   amoxicillin -clavulanate (AUGMENTIN ) 875-125 MG tablet Take 1 tablet by mouth every 12 (twelve) hours. (Patient not taking: Reported on 05/11/2024) 10 tablet 0   azithromycin  (ZITHROMAX  Z-PAK) 250 MG tablet Take 1 tablet (250 mg total) by mouth daily. (Patient not taking: Reported on 05/11/2024) 4 tablet 0   sertraline  (ZOLOFT ) 25 MG tablet Take 1 tablet (25 mg total) by mouth daily. (Patient not taking: Reported on 05/11/2024) 30 tablet 3   Varenicline  Tartrate, Starter, (CHANTIX  STARTING MONTH PAK) 0.5 MG X 11 & 1 MG X 42 TBPK Use and directed on packet. (Patient not taking: Reported on 05/11/2024) 53 each 0   No current facility-administered medications on file prior to visit.    Allergies  Allergen Reactions   Bee Venom Other (See Comments)    Passes Out, Dizziness Only Dirt-Dobbers - wasps    Social History   Socioeconomic History   Marital status: Single    Spouse name: Not on file   Number of children: Not on file   Years of education: Not on file   Highest education level: 12th grade  Occupational History   Not on file  Tobacco Use   Smoking status: Every Day    Current packs/day: 0.50    Types: Cigarettes   Smokeless tobacco: Former  Substance and Sexual Activity   Alcohol use: Yes    Comment: occ   Drug use: No   Sexual activity: Yes  Other Topics Concern   Not on file  Social History Narrative   Spouse Laneta is pregnant currently 09/18/2018   Social Drivers of Health   Financial Resource Strain: Low Risk  (05/07/2024)   Overall Financial Resource Strain (CARDIA)    Difficulty of Paying Living Expenses: Not very hard  Food Insecurity: Unknown (05/07/2024)    Hunger Vital Sign    Worried About Running Out of Food in the Last Year: Not on file    Ran Out of Food in the Last Year: Never true  Transportation Needs: No Transportation Needs (05/07/2024)   PRAPARE - Administrator, Civil Service (Medical): No    Lack of Transportation (Non-Medical): No  Physical Activity: Sufficiently Active (05/07/2024)   Exercise Vital Sign    Days of Exercise per Week: 7 days    Minutes of Exercise per Session: 30 min  Stress: Stress Concern Present (05/07/2024)   [employer] of Occupational Health - Occupational Stress Questionnaire    Feeling of Stress: To some extent  Social Connections: Moderately Isolated (05/07/2024)   Social Connection and Isolation Panel    Frequency of Communication with Friends and Family: Twice a week    Frequency of Social Gatherings with Friends and Family: Twice a week    Attends Religious Services: Never  Active Member of Clubs or Organizations: No    Attends Engineer, structural: Not on file    Marital Status: Married  Catering manager Violence: Not on file    Family History  Problem Relation Age of Onset   Diabetes Mother    Hypertension Mother     Past Surgical History:  Procedure Laterality Date   NO PAST SURGERIES      ROS: Review of Systems Negative except as stated above  PHYSICAL EXAM: BP (!) 151/103   Pulse 70   Temp 98.1 F (36.7 C) (Oral)   Ht 6' 2 (1.88 m)   Wt (!) 301 lb (136.5 kg)   SpO2 98%   BMI 38.65 kg/m   Physical Exam  General appearance - alert, well appearing, obese AAM in no distress Mental status - normal mood, behavior, speech, dress, motor activity, and thought processes.  Chest - clear to auscultation, no wheezes, rales or rhonchi, symmetric air entry Heart - normal rate, regular rhythm, normal S1, S2, no murmurs, rubs, clicks or gallops, no JVD  Groin - Wavy hyperpigmented appearance of the skin of the scrotum and upper inner thigh   Extremities -  no pedal edema noted  Diabetic foot exam was performed with the following findings:   Normal sensation of 10g monofilament Intact posterior tibialis and dorsalis pedis pulses Long thickened nails with flaky dry skin of b/l feet. No ulcerations or skin breakdown.          Latest Ref Rng & Units 03/07/2024    9:19 PM 03/06/2024    5:43 PM 08/21/2023    9:45 AM  CMP  Glucose 70 - 99 mg/dL 837  798  873   BUN 6 - 20 mg/dL 14  13  11    Creatinine 0.61 - 1.24 mg/dL 8.77  8.66  8.86   Sodium 135 - 145 mmol/L 134  133  144   Potassium 3.5 - 5.1 mmol/L 3.5  3.3  4.5   Chloride 98 - 111 mmol/L 96  97  103   CO2 22 - 32 mmol/L 23  22  24    Calcium  8.9 - 10.3 mg/dL 9.5  9.1  9.6   Total Protein 6.5 - 8.1 g/dL 7.3   7.2   Total Bilirubin 0.0 - 1.2 mg/dL 0.5   0.4   Alkaline Phos 38 - 126 U/L 96   110   AST 15 - 41 U/L 36   23   ALT 0 - 44 U/L 27   27    Lipid Panel     Component Value Date/Time   CHOL 138 08/21/2023 0945   TRIG 66 08/21/2023 0945   HDL 45 08/21/2023 0945   CHOLHDL 3.1 08/21/2023 0945   LDLCALC 79 08/21/2023 0945    CBC    Component Value Date/Time   WBC 12.2 (H) 03/07/2024 2119   RBC 4.85 03/07/2024 2119   HGB 13.1 03/07/2024 2119   HGB 14.1 10/25/2021 1132   HCT 40.0 03/07/2024 2119   HCT 43.5 10/25/2021 1132   PLT 149 (L) 03/07/2024 2119   PLT 212 10/25/2021 1132   MCV 82.5 03/07/2024 2119   MCV 82 10/25/2021 1132   MCH 27.0 03/07/2024 2119   MCHC 32.8 03/07/2024 2119   RDW 15.0 03/07/2024 2119   RDW 14.1 10/25/2021 1132   LYMPHSABS 0.7 03/07/2024 2119   MONOABS 1.3 (H) 03/07/2024 2119   EOSABS 0.0 03/07/2024 2119   BASOSABS 0.0 03/07/2024 2119  05/11/2024    3:30 PM 05/06/2022    2:19 PM 11/21/2021   11:09 AM 10/25/2021   10:16 AM  Depression screen PHQ 2/9  Decreased Interest 3 3 0 2  Down, Depressed, Hopeless 3 0 0 1  PHQ - 2 Score 6 3 0 3  Altered sleeping 1 1  2   Tired, decreased energy 3 1  3   Change in appetite 3 3  3   Feeling bad  or failure about yourself  1 0  0  Trouble concentrating 0 0  0  Moving slowly or fidgety/restless 0 0  0  Suicidal thoughts 0 0  0  PHQ-9 Score 14 8  11   Difficult doing work/chores Somewhat difficult        ASSESSMENT AND PLAN:  Assessment and Plan  1. Type 2 diabetes mellitus in patient with obesity (HCC) (Primary) Not at goal and A1c has worsened since June. Will increase metformin  to 1000 BID. We discussed cutting portions in half, limiting starches, and consuming vegetables with each meal. Informed him that fruit intake is fine in moderation. Due for UACR and chemistries.  - Continue with scheduled eye exam. Asked him to let [facility] know he has diabetes and will need more extensive evaluation, and that we would like a copy of those records.   - Microalbumin / creatinine, urine ratio - CBC - POCT glucose (manual entry) - POCT glycosylated hemoglobin (Hb A1C) - metFORMIN  (GLUCOPHAGE ) 1000 MG tablet; Take 1 tablet (1,000 mg total) by mouth 2 (two) times daily with a meal.  Dispense: 180 tablet; Refill: 1 - Basic Metabolic Panel   2. Diabetes mellitus treated with oral medication (HCC) #1 as above  3. Hypertension associated with type 2 diabetes mellitus (HCC) Not at goal. Will add spironolactone 12.5 daily for better BP control. Advised him to limit the salt in his foods and check his BP at least twice a week. Will check for secondary causes as well given his compliance to daily medications without much improvement in BP. Commended him on his exercising efforts. - Recommended limiting sodium intake including red meats, processed foods, and fried foods. Encouraged him to eat more home cooked meals. - Continue Diovan -HCTZ and amlodipine  daily - Add spironolactone 12.5 daily  - amLODipine  (NORVASC ) 10 MG tablet; Take 1 tablet (10 mg total) by mouth daily.  Dispense: 90 tablet; Refill: 1 - valsartan -hydrochlorothiazide  (DIOVAN -HCT) 320-25 MG tablet; Take 1 tablet by mouth  daily.Stop single-agent valsartan !  Dispense: 90 tablet; Refill: 1 - Metanephrines, plasma - Aldosterone + renin activity w/ ratio - Catecholamines, fractionated, plasma - spironolactone (ALDACTONE) 25 MG tablet; Take 0.5 tablets (12.5 mg total) by mouth daily.  Dispense: 45 tablet; Refill: 1  4. Hyperlipidemia associated with type 2 diabetes mellitus (HCC) Stable. Continue atorvastatin . Previous LDL was 79 with a goal of less than 70. - atorvastatin  (LIPITOR) 20 MG tablet; Take 1 tablet (20 mg total) by mouth daily.  Dispense: 90 tablet; Refill: 1  5. Tinea cruris Advised him to apply Mycolog cream to his groin area daily until sx resolve. - nystatin-triamcinolone ointment (MYCOLOG); Apply 1 Application topically 2 (two) times daily.  Dispense: 30 g; Refill: 0  6. Moderate recurrent major depression (HCC) We recommended instead of restarting Zoloft , to change to Wellbutrin to help with both smoking cessation and depression, to which he was agreeable to. Will start him on Wellbutrin 150 daily. Denies self harming thoughts or behaviors. Will have him f/u in 6 weeks to assess treatment  response. - buPROPion (WELLBUTRIN XL) 150 MG 24 hr tablet; Take 1 tablet (150 mg total) by mouth daily.  Dispense: 60 tablet; Refill: 0  7. Tobacco dependence Started Wellbutrin as above in #6. Strongly encouraged smoking cessation. Provided 1-800-QUIT-NOW number for further counseling.  8. Abnormal findings on diagnostic imaging of lung Will order CT chest to confirm resolution of pneumonia and reassess hilar lymphadenopathy and borderline cardiomegaly. Will also assess for etiology of his 5-6 months of weekly dyspnea at night w/ recent productive cough. Exam reassuring at this time. - CT Chest W Contrast; Future  9. Influenza vaccination declined  10. Tetanus, diphtheria, and acellular pertussis (Tdap) vaccination declined  11. Pneumococcal vaccination declined  12. Hepatitis B vaccination  declined    Patient was given the opportunity to ask questions.  Patient verbalized understanding of the plan and was able to repeat key elements of the plan.     Orders Placed This Encounter  Procedures   CT Chest W Contrast   Microalbumin / creatinine, urine ratio   CBC   Basic Metabolic Panel   Metanephrines, plasma   Aldosterone + renin activity w/ ratio   Catecholamines, fractionated, plasma   POCT glucose (manual entry)   POCT glycosylated hemoglobin (Hb A1C)     Requested Prescriptions   Signed Prescriptions Disp Refills   atorvastatin  (LIPITOR) 20 MG tablet 90 tablet 1    Sig: Take 1 tablet (20 mg total) by mouth daily.   amLODipine  (NORVASC ) 10 MG tablet 90 tablet 1    Sig: Take 1 tablet (10 mg total) by mouth daily.   metFORMIN  (GLUCOPHAGE ) 1000 MG tablet 180 tablet 1    Sig: Take 1 tablet (1,000 mg total) by mouth 2 (two) times daily with a meal.   valsartan -hydrochlorothiazide  (DIOVAN -HCT) 320-25 MG tablet 90 tablet 1    Sig: Take 1 tablet by mouth daily.Stop single-agent valsartan !   spironolactone (ALDACTONE) 25 MG tablet 45 tablet 1    Sig: Take 0.5 tablets (12.5 mg total) by mouth daily.   nystatin-triamcinolone ointment (MYCOLOG) 30 g 0    Sig: Apply 1 Application topically 2 (two) times daily.   buPROPion (WELLBUTRIN XL) 150 MG 24 hr tablet 60 tablet 0    Sig: Take 1 tablet (150 mg total) by mouth daily.    Return in about 6 weeks (around 06/22/2024) for for depression f/u, started on Welbutrtin for depression and smoking cessation.  This is a Psychologist, occupational Note.  The care of the patient was discussed with Dr. Vicci and the assessment and plan formulated with her assistance.  Please see her attestation of this encounter.  Duwaine Amber, MS3 [employer]   Evaluation and management procedures were performed by me with Medical Student in attendance, note written by Medical Student under my supervision and collaboration. I have reviewed the note and I agree  with the management and plan.   Barnie Vicci, MD, FAAFP. [facility] [place], [place] [phone]   05/11/2024, 5:40 PM

## 2024-05-11 NOTE — Patient Instructions (Addendum)
 Please call the 1-800-QUIT-NOW for smoking.   Please let [facility] know that you have diabetes so they can look in the back of the eyes. Have them send us  a note about your results.   We have started you on Wellbutrin. This can treat depression and decrease cravings for smoking. Please follow up in 6 weeks.

## 2024-05-12 ENCOUNTER — Other Ambulatory Visit: Payer: Self-pay | Admitting: Internal Medicine

## 2024-05-12 ENCOUNTER — Other Ambulatory Visit: Payer: Self-pay

## 2024-05-12 ENCOUNTER — Other Ambulatory Visit: Payer: Self-pay | Admitting: Pharmacist

## 2024-05-12 ENCOUNTER — Encounter: Payer: Self-pay | Admitting: Internal Medicine

## 2024-05-12 ENCOUNTER — Ambulatory Visit: Payer: Self-pay | Admitting: Internal Medicine

## 2024-05-12 DIAGNOSIS — E669 Obesity, unspecified: Secondary | ICD-10-CM

## 2024-05-12 DIAGNOSIS — I152 Hypertension secondary to endocrine disorders: Secondary | ICD-10-CM

## 2024-05-12 DIAGNOSIS — F331 Major depressive disorder, recurrent, moderate: Secondary | ICD-10-CM

## 2024-05-12 DIAGNOSIS — B356 Tinea cruris: Secondary | ICD-10-CM

## 2024-05-12 DIAGNOSIS — E1169 Type 2 diabetes mellitus with other specified complication: Secondary | ICD-10-CM

## 2024-05-12 MED ORDER — AMLODIPINE BESYLATE 10 MG PO TABS: 10.0000 mg | ORAL_TABLET | Freq: Every day | ORAL | 1 refills | Status: AC

## 2024-05-12 MED ORDER — NYSTATIN-TRIAMCINOLONE 100000-0.1 UNIT/GM-% EX OINT: 1.0000 | TOPICAL_OINTMENT | Freq: Two times a day (BID) | CUTANEOUS | 0 refills | Status: AC

## 2024-05-12 MED ORDER — METFORMIN HCL 1000 MG PO TABS: 1000.0000 mg | ORAL_TABLET | Freq: Two times a day (BID) | ORAL | 1 refills | Status: AC

## 2024-05-12 MED ORDER — ATORVASTATIN CALCIUM 20 MG PO TABS: 20.0000 mg | ORAL_TABLET | Freq: Every day | ORAL | 1 refills | Status: AC

## 2024-05-12 MED ORDER — SPIRONOLACTONE 25 MG PO TABS: 12.5000 mg | ORAL_TABLET | Freq: Every day | ORAL | 1 refills | Status: AC

## 2024-05-12 MED ORDER — BUPROPION HCL ER (XL) 150 MG PO TB24: 150.0000 mg | ORAL_TABLET | Freq: Every day | ORAL | 0 refills | Status: AC

## 2024-05-12 MED ORDER — VALSARTAN-HYDROCHLOROTHIAZIDE 320-25 MG PO TABS: 1.0000 | ORAL_TABLET | Freq: Every day | ORAL | 1 refills | Status: AC

## 2024-05-13 ENCOUNTER — Other Ambulatory Visit: Payer: Self-pay

## 2024-05-18 LAB — BASIC METABOLIC PANEL WITH GFR
BUN/Creatinine Ratio: 12 (ref 9–20)
BUN: 13 mg/dL (ref 6–20)
CO2: 24 mmol/L (ref 20–29)
Calcium: 9.7 mg/dL (ref 8.7–10.2)
Chloride: 98 mmol/L (ref 96–106)
Creatinine, Ser: 1.05 mg/dL (ref 0.76–1.27)
Glucose: 106 mg/dL — ABNORMAL HIGH (ref 70–99)
Potassium: 4.1 mmol/L (ref 3.5–5.2)
Sodium: 138 mmol/L (ref 134–144)
eGFR: 93 mL/min/1.73 (ref >59)

## 2024-05-18 LAB — CBC
Hematocrit: 42.6 % (ref 37.5–51.0)
Hemoglobin: 13.4 g/dL (ref 13.0–17.7)
MCH: 27.3 pg (ref 26.6–33.0)
MCHC: 31.5 g/dL (ref 31.5–35.7)
MCV: 87 fL (ref 79–97)
Platelets: 226 x10E3/uL (ref 150–450)
RBC: 4.91 x10E6/uL (ref 4.14–5.80)
RDW: 14.8 % (ref 11.6–15.4)
WBC: 10.9 x10E3/uL — ABNORMAL HIGH (ref 3.4–10.8)

## 2024-05-18 LAB — ALDOSTERONE + RENIN ACTIVITY W/ RATIO
Aldos/Renin Ratio: 28.2 (ref 0.0–30.0)
Renin Activity, Plasma: 28.2 ng/mL/h (ref 0.167–5.380)
Renin Activity, Plasma: 8.8 ng/mL/h (ref 0.167–5.380)

## 2024-05-18 LAB — CATECHOLAMINES, FRACTIONATED, PLASMA
Dopamine: 31.8 pg/mL (ref 0.0–36.7)
Epinephrine: 37.6 pg/mL (ref 0.0–55.4)
Norepinephrine: 835 pg/mL — ABNORMAL HIGH (ref 115–524)

## 2024-05-18 LAB — MICROALBUMIN / CREATININE URINE RATIO
Creatinine, Urine: 170.5 mg/dL
Microalb/Creat Ratio: 10 mg/g{creat} (ref 0–29)
Microalbumin, Urine: 16.3 ug/mL

## 2024-05-18 LAB — METANEPHRINES, PLASMA
Metanephrine, Free: 37.9 pg/mL (ref 0.0–88.0)
Normetanephrine, Free: 167.4 pg/mL (ref 0.0–210.1)

## 2024-05-19 ENCOUNTER — Other Ambulatory Visit: Payer: Self-pay | Admitting: Internal Medicine

## 2024-05-19 DIAGNOSIS — R825 Elevated urine levels of drugs, medicaments and biological substances: Secondary | ICD-10-CM

## 2024-06-30 ENCOUNTER — Telehealth: Payer: Self-pay | Admitting: Internal Medicine

## 2024-06-30 NOTE — Telephone Encounter (Signed)
 LVM TO CONFIRM APPT.

## 2024-07-01 ENCOUNTER — Ambulatory Visit: Admitting: Internal Medicine

## 2024-08-05 ENCOUNTER — Ambulatory Visit: Admitting: Internal Medicine
